# Patient Record
Sex: Male | Born: 1980 | Race: Black or African American | Hispanic: No | Marital: Single | Smoking: Never smoker
Health system: Southern US, Community
[De-identification: ages and names within clinical notes are randomized; demographics above are authoritative.]

## PROBLEM LIST (undated history)

## (undated) DIAGNOSIS — G4730 Sleep apnea, unspecified: Secondary | ICD-10-CM

## (undated) DIAGNOSIS — E119 Type 2 diabetes mellitus without complications: Secondary | ICD-10-CM

## (undated) DIAGNOSIS — I10 Essential (primary) hypertension: Secondary | ICD-10-CM

## (undated) HISTORY — PX: INSERTION OF MESH: SHX5868

## (undated) HISTORY — PX: HERNIA REPAIR: SHX51

---

## 2004-09-14 ENCOUNTER — Emergency Department: Payer: Self-pay | Admitting: Emergency Medicine

## 2006-08-18 ENCOUNTER — Other Ambulatory Visit: Payer: Self-pay

## 2006-08-18 ENCOUNTER — Emergency Department: Payer: Self-pay | Admitting: Emergency Medicine

## 2008-06-13 ENCOUNTER — Emergency Department: Payer: Self-pay | Admitting: Emergency Medicine

## 2008-09-07 ENCOUNTER — Emergency Department: Payer: Self-pay | Admitting: Emergency Medicine

## 2008-09-20 ENCOUNTER — Emergency Department: Payer: Self-pay | Admitting: Emergency Medicine

## 2008-10-14 ENCOUNTER — Emergency Department: Payer: Self-pay | Admitting: Emergency Medicine

## 2009-12-08 ENCOUNTER — Emergency Department: Payer: Self-pay | Admitting: Emergency Medicine

## 2010-02-05 ENCOUNTER — Emergency Department: Payer: Self-pay | Admitting: Emergency Medicine

## 2010-04-24 ENCOUNTER — Ambulatory Visit: Payer: Self-pay | Admitting: Emergency Medicine

## 2012-02-27 ENCOUNTER — Emergency Department: Payer: Self-pay | Admitting: Emergency Medicine

## 2012-02-27 LAB — URINALYSIS, COMPLETE
Blood: NEGATIVE
Glucose,UR: NEGATIVE mg/dL (ref 0–75)
Leukocyte Esterase: NEGATIVE
Nitrite: NEGATIVE
Ph: 6 (ref 4.5–8.0)
RBC,UR: 2 /HPF (ref 0–5)
Specific Gravity: 1.023 (ref 1.003–1.030)
Squamous Epithelial: <1
WBC UR: 15 /HPF (ref 0–5)

## 2012-02-29 ENCOUNTER — Emergency Department: Payer: Self-pay | Admitting: Emergency Medicine

## 2012-05-17 ENCOUNTER — Emergency Department: Payer: Self-pay | Admitting: Emergency Medicine

## 2012-06-23 ENCOUNTER — Emergency Department: Payer: Self-pay | Admitting: Emergency Medicine

## 2012-06-23 LAB — CBC
HGB: 15.5 g/dL (ref 13.0–18.0)
MCH: 26.5 pg (ref 26.0–34.0)
MCHC: 32.4 g/dL (ref 32.0–36.0)
MCV: 82 fL (ref 80–100)
Platelet: 194 10*3/uL (ref 150–440)
RBC: 5.87 10*6/uL (ref 4.40–5.90)
RDW: 16.2 % — ABNORMAL HIGH (ref 11.5–14.5)
WBC: 10.2 10*3/uL (ref 3.8–10.6)

## 2012-06-23 LAB — BASIC METABOLIC PANEL
BUN: 11 mg/dL (ref 7–18)
Chloride: 108 mmol/L — ABNORMAL HIGH (ref 98–107)
Co2: 28 mmol/L (ref 21–32)
Creatinine: 0.99 mg/dL (ref 0.60–1.30)
EGFR (African American): >60
EGFR (Non-African Amer.): >60
Glucose: 119 mg/dL — ABNORMAL HIGH (ref 65–99)
Osmolality: 282 (ref 275–301)
Potassium: 3.9 mmol/L (ref 3.5–5.1)

## 2012-08-22 ENCOUNTER — Emergency Department: Payer: Self-pay | Admitting: Emergency Medicine

## 2012-08-29 ENCOUNTER — Emergency Department: Payer: Self-pay | Admitting: Emergency Medicine

## 2013-03-25 ENCOUNTER — Emergency Department: Payer: Self-pay | Admitting: Internal Medicine

## 2013-08-13 ENCOUNTER — Emergency Department: Payer: Self-pay | Admitting: Emergency Medicine

## 2013-08-13 LAB — CBC
HCT: 47.3 % (ref 40.0–52.0)
HGB: 15 g/dL (ref 13.0–18.0)
MCH: 26.8 pg (ref 26.0–34.0)
MCHC: 31.7 g/dL — AB (ref 32.0–36.0)
MCV: 85 fL (ref 80–100)
PLATELETS: 166 10*3/uL (ref 150–440)
RBC: 5.6 10*6/uL (ref 4.40–5.90)
RDW: 15.6 % — ABNORMAL HIGH (ref 11.5–14.5)
WBC: 7.7 10*3/uL (ref 3.8–10.6)

## 2013-08-13 LAB — BASIC METABOLIC PANEL
ANION GAP: 8 (ref 7–16)
BUN: 10 mg/dL (ref 7–18)
CALCIUM: 8.2 mg/dL — AB (ref 8.5–10.1)
CREATININE: 0.78 mg/dL (ref 0.60–1.30)
Chloride: 108 mmol/L — ABNORMAL HIGH (ref 98–107)
Co2: 26 mmol/L (ref 21–32)
EGFR (Non-African Amer.): >60
Glucose: 137 mg/dL — ABNORMAL HIGH (ref 65–99)
Osmolality: 284 (ref 275–301)
Potassium: 3.8 mmol/L (ref 3.5–5.1)
Sodium: 142 mmol/L (ref 136–145)

## 2013-08-13 LAB — TROPONIN I: Troponin-I: <0.02 ng/mL

## 2013-09-06 ENCOUNTER — Emergency Department: Payer: Self-pay | Admitting: Emergency Medicine

## 2016-05-30 ENCOUNTER — Emergency Department: Payer: Self-pay

## 2016-05-30 ENCOUNTER — Emergency Department
Admission: EM | Admit: 2016-05-30 | Discharge: 2016-05-30 | Disposition: A | Discharge status: Home / Self Care | Payer: Self-pay | Attending: Emergency Medicine | Admitting: Emergency Medicine

## 2016-05-30 ENCOUNTER — Encounter: Payer: Self-pay | Admitting: Emergency Medicine

## 2016-05-30 DIAGNOSIS — M79602 Pain in left arm: Secondary | ICD-10-CM | POA: Insufficient documentation

## 2016-05-30 DIAGNOSIS — R200 Anesthesia of skin: Secondary | ICD-10-CM | POA: Insufficient documentation

## 2016-05-30 DIAGNOSIS — R0789 Other chest pain: Secondary | ICD-10-CM | POA: Insufficient documentation

## 2016-05-30 LAB — CBC
HCT: 50.6 % (ref 40.0–52.0)
Hemoglobin: 16.6 g/dL (ref 13.0–18.0)
MCH: 26.9 pg (ref 26.0–34.0)
MCHC: 32.7 g/dL (ref 32.0–36.0)
MCV: 82.3 fL (ref 80.0–100.0)
PLATELETS: 159 10*3/uL (ref 150–440)
RBC: 6.15 MIL/uL — AB (ref 4.40–5.90)
RDW: 15.5 % — ABNORMAL HIGH (ref 11.5–14.5)
WBC: 9.1 10*3/uL (ref 3.8–10.6)

## 2016-05-30 LAB — BASIC METABOLIC PANEL
ANION GAP: 5 (ref 5–15)
BUN: 9 mg/dL (ref 6–20)
CHLORIDE: 106 mmol/L (ref 101–111)
CO2: 29 mmol/L (ref 22–32)
CREATININE: 0.93 mg/dL (ref 0.61–1.24)
Calcium: 9 mg/dL (ref 8.9–10.3)
GFR calc non Af Amer: >60 mL/min (ref >60)
Glucose, Bld: 159 mg/dL — ABNORMAL HIGH (ref 65–99)
POTASSIUM: 4.3 mmol/L (ref 3.5–5.1)
SODIUM: 140 mmol/L (ref 135–145)

## 2016-05-30 LAB — TROPONIN I
Troponin I: <0.03 ng/mL (ref <0.03)
Troponin I: <0.03 ng/mL (ref <0.03)

## 2016-05-30 MED ORDER — KETOROLAC TROMETHAMINE 30 MG/ML IJ SOLN
30.0000 mg | Freq: Once | INTRAMUSCULAR | Status: AC
  Administered 2016-05-30: 30 mg via INTRAMUSCULAR
  Filled 2016-05-30: qty 1

## 2016-05-30 MED ORDER — PREDNISONE 20 MG PO TABS
40.0000 mg | ORAL_TABLET | Freq: Once | ORAL | Status: AC
  Administered 2016-05-30: 40 mg via ORAL
  Filled 2016-05-30: qty 2

## 2016-05-30 MED ORDER — PREDNISONE 20 MG PO TABS: 40.0000 mg | ORAL_TABLET | Freq: Every day | ORAL | 0 refills | Status: DC

## 2016-05-30 MED ORDER — HYDROCODONE-ACETAMINOPHEN 5-325 MG PO TABS: 1.0000 | ORAL_TABLET | Freq: Four times a day (QID) | ORAL | 0 refills | Status: DC | PRN

## 2016-05-30 NOTE — Discharge Instructions (Signed)
You have been seen in the Emergency Department (ED) today for chest pain.  As we have discussed today?s test results are normal, and we believe your pain is due to pain/strain and/or inflammation of the muscles and/or cartilage of your chest wall.  We recommend you take ibuprofen 600 mg three times a day with meals for the next 5 days (unless you have been told previously not to take ibuprofen or NSAIDs in general).    Return to the Emergency Department (ED) if you experience any further chest pain/pressure/tightness, difficulty breathing, or sudden sweating, or other symptoms that concern you.

## 2016-05-30 NOTE — ED Triage Notes (Signed)
Patient presents with left arm pain/numbness and tingling for 2 days with LROM

## 2016-05-30 NOTE — ED Provider Notes (Signed)
[place] [facility] and interpreted by me at noon [facility] Emergency [facility] Provider Note   ____________________________________________   First MD Initiated Contact with Patient 05/30/16 1342     (approximate)  I have reviewed the triage vital signs and the nursing notes.   HISTORY  Chief Complaint Arm Pain    HPI Tony Ham. is a 36 y.o. male report no medical history  Patient reports that for about 2 days he started experiencing pain in his upper back that radiates down towards his shoulder and towards the fingertips of his left hand. Feels like a tingling discomfort. He did not injure the arm. He reports the pain is moderate to severe, much worse when he tries to move the left arm or pick up his left arm.  Reports a slight tingling feeling, but denies that he lost sensation. He reports his muscle strength is normal, but hurts to move. He denies having any "chest pain" or have any trouble breathing.  He has not had anything to alleviate the symptoms. Not worsened by walking, but worsened by trying to move the left arm.  Minimal discomfort in the neck.  No fevers chills or trouble breathing.  Denies any history of heart problems.   History reviewed. No pertinent past medical history.  There are no active problems to display for this patient.   No past surgical history on file.  Prior to Admission medications   Medication Sig Start Date End Date Taking? Authorizing Provider  HYDROcodone-acetaminophen (NORCO/VICODIN) 5-325 MG tablet Take 1 tablet by mouth every 6 (six) hours as needed for moderate pain. 05/30/16   Sharyn Creamer, MD  predniSONE (DELTASONE) 20 MG tablet Take 2 tablets (40 mg total) by mouth daily. 05/30/16   Sharyn Creamer, MD  none  Allergies Patient has no allergy information on record.  History reviewed. No pertinent family history.  Social History Social History  Substance Use Topics  . Smoking status: Not on file  .  Smokeless tobacco: Not on file  . Alcohol use Not on file    Review of Systems Constitutional: No fever/chills Eyes: No visual changes. ENT: No sore throat. Cardiovascular: Denies chest pain. Respiratory: Denies shortness of breath. Gastrointestinal: No abdominal pain.  No nausea, no vomiting.   Genitourinary: Negative for dysuria. Musculoskeletal: see history of present illness Skin: Negative for rash. Neurological: Negative for headaches, focal weakness or numbness.no trouble speaking. No weakness in the arms or legs. No trouble walking  10-point ROS otherwise negative.  ____________________________________________   PHYSICAL EXAM:  VITAL SIGNS: ED Triage Vitals  Enc Vitals Group     BP 05/30/16 0904 (!) 184/105     Pulse Rate 05/30/16 0904 91     Resp 05/30/16 0904 20     Temp 05/30/16 0904 98.9 F (37.2 C)     Temp Source 05/30/16 0904 Oral     SpO2 05/30/16 0904 93 %     Weight 05/30/16 0905 (!) 375 lb (170.1 kg)     Height 05/30/16 0905  (1.88 m)     Head Circumference --      Peak Flow --      Pain Score 05/30/16 0904 8     Pain Loc --      Pain Edu? --      Excl. in GC? --     Constitutional: Alert and oriented. Well appearing and in no acute distress. Eyes: Conjunctivae are normal. PERRL. EOMI. Head: Atraumatic. Nose: No congestion/rhinnorhea. Mouth/Throat: Mucous  membranes are moist.  Oropharynx non-erythematous. Neck: No stridor.  No midline cervical tenderness. He does report tenderness along the left paraspinous muscles, his tenderness across the trapezius that he reports radiates pain into his left arm and shoulder with palpation. Cardiovascular: Normal rate, regular rhythm. Grossly normal heart sounds.  Good peripheral circulation. Respiratory: Normal respiratory effort.  No retractions. Lungs CTAB. Gastrointestinal: Soft and nontender. No distention. No abdominal bruits. No CVA tenderness. Musculoskeletal: No lower extremity tenderness nor  edema.  No joint effusions. Neurologic:  Normal speech and language. No gross focal neurologic deficits are appreciated. No gait instability. Skin:  Skin is warm, dry and intact. No rash noted. Psychiatric: Mood and affect are normal. Speech and behavior are normal.  ____________________________________________   LABS (all labs ordered are listed, but only abnormal results are displayed)  Labs Reviewed  BASIC METABOLIC PANEL - Abnormal; Notable for the following:       Result Value   Glucose, Bld 159 (*)    All other components within normal limits  CBC - Abnormal; Notable for the following:    RBC 6.15 (*)    RDW 15.5 (*)    All other components within normal limits  TROPONIN I  TROPONIN I   ____________________________________________  EKG ED ECG REPORT I, QUALE, MARK, the attending physician, personally viewed and interpreted this ECG.  Date: 05/30/2016 EKG Time: 905 Rate: 85 Rhythm: normal sinus rhythm QRS Axis: normal Intervals: normal ST/T Wave abnormalities: normal Conduction Disturbances: none Narrative Interpretation: unremarkable   ____________________________________________  RADIOLOGY  Dg Chest 2 View  Result Date: 05/30/2016 CLINICAL DATA:  Pain EXAM: CHEST  2 VIEW COMPARISON:  March 25, 2013 FINDINGS: There is no edema or consolidation. The heart size and pulmonary vascularity are normal. No adenopathy. No pneumothorax. No bone lesions. IMPRESSION: No edema or consolidation. Electronically Signed   By: Bretta Bang III M.D.   On: 05/30/2016 14:17    ____________________________________________   PROCEDURES  Procedure(s) performed: None  Procedures  Critical Care performed: No  ____________________________________________   INITIAL IMPRESSION / ASSESSMENT AND PLAN / ED COURSE  Pertinent labs & imaging results that were available during my care of the patient were reviewed by me and considered in my medical decision making (see chart  for details).    Clinical Course as of May 30 1652  Sat May 30, 2016  1342 There was a delay in seeing this patient due to a critical resuscitation.  [MQ]  1539 Patient now using his left arm well. Reports his pain is much better and he feels improved.  [MQ]    Clinical Course User Index [MQ] Sharyn Creamer, MD        Pulmonary Embolism Rule-out Criteria (PERC rule)                        If YES to ANY of the following, the [facility] The rule is not satisfied and cannot be used to rule out PE in this patient (consider d-dimer or imaging depending on pre-test probability).                      If NO to ALL of the following, AND the clinician's pre-test probability is <15%, the [facility] rule is satisfied and there is no need for further workup (including no need to obtain a d-dimer) as the post-test probability of pulmonary embolism is <2%.  Mnemonic is HAD CLOTS   H - hormone use (exogenous estrogen)      No. A - age > 50                                                 No. D - DVT/PE history                                      No.   C - coughing blood (hemoptysis)                 No. L - leg swelling, unilateral                             No. O - O2 Sat on Room Air < 95%                  No. T - tachycardia (HR ? 100)                         No. S - surgery or trauma, recent                      No.   Based on my evaluation of the patient, including application of this decision instrument, further testing to evaluate for pulmonary embolism is not indicated at this time.  Treatments for evaluation of left upper back and arm pain. Very reproducible, with no symptoms of typical acute coronary syndrome. No risk factors for pulmonary emboli some. Denies chest pain or trouble breathing.  His examination appears consistent with musculoskeletal chest pain, and I feel is very low pretest probability for acute emergent cause such as coronary syndrome. The patient does have somewhat  radicular distribution of his pain, but he has no evidence of a dense sensory deficit or musculoskeletal or vascular compromise. At this point and discussed with the patient we'll treat him conservatively with brief course of steroids, pain medication, and close follow-up with orthopedics team.  Discuss careful return precautions with the patient including returning if he is weakness in the arm, increasing numbness, trouble using the arm or hand, develops a cold or blue hand, chest pain trouble breathing or other concerns arise and he is very agreeable.  I will prescribe the patient a narcotic pain medicine due to their condition which I anticipate will cause at least moderate pain short term. I discussed with the patient safe use of narcotic pain medicines, and that they are not to drive, work in dangerous areas, or ever take more than prescribed (no more than 1 pill every 6 hours). We discussed that this is the type of medication that can be  overdosed on and the risks of this type of medicine. Patient is very agreeable to only use as prescribed and to never use more than prescribed.     ____________________________________________   FINAL CLINICAL IMPRESSION(S) / ED DIAGNOSES  Final diagnoses:  Musculoskeletal chest pain  Chest pain, atypical      NEW MEDICATIONS STARTED DURING THIS VISIT:  New Prescriptions   HYDROCODONE-ACETAMINOPHEN (NORCO/VICODIN) 5-325 MG TABLET    Take 1 tablet by mouth every 6 (six) hours as needed for  moderate pain.   PREDNISONE (DELTASONE) 20 MG TABLET    Take 2 tablets (40 mg total) by mouth daily.     Note:  This document was prepared using Dragon voice recognition software and may include unintentional dictation errors.     Sharyn Creamer, MD 05/30/16 1655

## 2016-05-30 NOTE — ED Notes (Signed)
Pt states pain starts in the back goes through the shoulder down the arm into the fingertips and hurts for this nurse to palpate or for pt to move. Denies injury. Pain x 2 days.

## 2016-06-12 ENCOUNTER — Encounter: Payer: Self-pay | Admitting: Emergency Medicine

## 2016-06-12 DIAGNOSIS — M5412 Radiculopathy, cervical region: Secondary | ICD-10-CM | POA: Insufficient documentation

## 2016-06-12 DIAGNOSIS — F17200 Nicotine dependence, unspecified, uncomplicated: Secondary | ICD-10-CM | POA: Insufficient documentation

## 2016-06-12 NOTE — ED Triage Notes (Signed)
Pt ambulatory to triage in NAD, report left shoulder pain x 1 week, was seen earlier and had cardiac workup and does not want to do that again. Pt states he feels it could be pinched nerve.

## 2016-06-13 ENCOUNTER — Emergency Department: Payer: Self-pay

## 2016-06-13 ENCOUNTER — Emergency Department
Admission: EM | Admit: 2016-06-13 | Discharge: 2016-06-13 | Disposition: A | Discharge status: Home / Self Care | Payer: Self-pay | Attending: Emergency Medicine | Admitting: Emergency Medicine

## 2016-06-13 DIAGNOSIS — M5412 Radiculopathy, cervical region: Secondary | ICD-10-CM

## 2016-06-13 MED ORDER — HYDROCODONE-ACETAMINOPHEN 5-325 MG PO TABS
2.0000 | ORAL_TABLET | Freq: Once | ORAL | Status: AC
  Administered 2016-06-13: 2 via ORAL
  Filled 2016-06-13: qty 2

## 2016-06-13 MED ORDER — HYDROCODONE-ACETAMINOPHEN 5-325 MG PO TABS: 1.0000 | ORAL_TABLET | ORAL | 0 refills | Status: DC | PRN

## 2016-06-13 NOTE — Discharge Instructions (Signed)
Please make an appointment to follow-up with neurosurgery within the next week or so for reevaluation. Return to the emergency department sooner for any new or worsening symptoms.  It was a pleasure to take care of you today, and thank you for coming to our emergency department.  If you have any questions or concerns before leaving please ask the nurse to grab me and I'm more than happy to go through your aftercare instructions again.  If you were prescribed any opioid pain medication today such as Norco, Vicodin, Percocet, morphine, hydrocodone, or oxycodone please make sure you do not drive when you are taking this medication as it can alter your ability to drive safely.  If you have any concerns once you are home that you are not improving or are in fact getting worse before you can make it to your follow-up appointment, please do not hesitate to call 911 and come back for further evaluation.  Tony Brittle MD  Results for orders placed or performed during the hospital encounter of 05/30/16  Basic metabolic panel  Result Value Ref Range   Sodium 140 135 - 145 mmol/L   Potassium 4.3 3.5 - 5.1 mmol/L   Chloride 106 101 - 111 mmol/L   CO2 29 22 - 32 mmol/L   Glucose, Bld 159 (H) 65 - 99 mg/dL   BUN 9 6 - 20 mg/dL   Creatinine, Ser 1.61 0.61 - 1.24 mg/dL   Calcium 9.0 8.9 - 09.6 mg/dL   GFR calc non Af Amer >60 >60 mL/min   GFR calc Af Amer >60 >60 mL/min   Anion gap 5 5 - 15  CBC  Result Value Ref Range   WBC 9.1 3.8 - 10.6 K/uL   RBC 6.15 (H) 4.40 - 5.90 MIL/uL   Hemoglobin 16.6 13.0 - 18.0 g/dL   HCT 04.5 40.9 - 81.1 %   MCV 82.3 80.0 - 100.0 fL   MCH 26.9 26.0 - 34.0 pg   MCHC 32.7 32.0 - 36.0 g/dL   RDW 91.4 (H) 78.2 - 95.6 %   Platelets 159 150 - 440 K/uL  Troponin I  Result Value Ref Range   Troponin I <0.03 <0.03 ng/mL  Troponin I  Result Value Ref Range   Troponin I <0.03 <0.03 ng/mL   Dg Chest 2 View  Result Date: 05/30/2016 CLINICAL DATA:  Pain EXAM: CHEST  2  VIEW COMPARISON:  March 25, 2013 FINDINGS: There is no edema or consolidation. The heart size and pulmonary vascularity are normal. No adenopathy. No pneumothorax. No bone lesions. IMPRESSION: No edema or consolidation. Electronically Signed   By: Bretta Bang III M.D.   On: 05/30/2016 14:17   Dg Cervical Spine Complete  Result Date: 06/13/2016 CLINICAL DATA:  Left shoulder radiculopathy since the end of April. Tingling with intermittent numbness in the left fingers. EXAM: CERVICAL SPINE - COMPLETE 4+ VIEW COMPARISON:  Soft tissue neck 06/23/2012 FINDINGS: Reversal of the usual cervical lordosis without anterior subluxation. This may be due to patient positioning but ligamentous injury or muscle spasm could also have this appearance and are not excluded. Normal alignment of the facet joints. No significant bone encroachment upon the neural foramina. C1-2 articulation appears intact. No vertebral compression deformities. No prevertebral soft tissue swelling. No focal bone lesion or bone destruction. Degenerative changes with disc space narrowing and end plate hypertrophic changes at C4-5, C5-6, and C6-7 levels. IMPRESSION: Nonspecific reversal of the usual cervical lordosis. Degenerative changes in the cervical spine. No acute  displaced fractures identified. Electronically Signed   By: Burman NievesWilliam  Stevens M.D.   On: 06/13/2016 05:18   Dg Shoulder Left  Result Date: 06/13/2016 CLINICAL DATA:  Acute onset of left shoulder pain. Initial encounter. EXAM: LEFT SHOULDER - 2+ VIEW COMPARISON:  None. FINDINGS: There is no evidence of fracture or dislocation. The left humeral head is seated within the glenoid fossa. Minimal degenerative sclerosis is noted at the left glenoid. The acromioclavicular joint is unremarkable in appearance. No significant soft tissue abnormalities are seen. The visualized portions of the left lung are clear. IMPRESSION: No evidence of fracture or dislocation. Electronically Signed   By:  Roanna RaiderJeffery  Chang M.D.   On: 06/13/2016 00:23

## 2016-06-13 NOTE — ED Notes (Signed)
Had to awake pt from sleep to complete assessment. Pt states he has posterior and anterior left shoulder pain since the end of [place]. Pt describes pain as tingling with intermittent numbness in left fingers. Cms currently intact to left fingers. 3+ left radial pulse noted. Pt states pain is worse with movement and "lying down".

## 2016-06-13 NOTE — ED Notes (Signed)
Pt was sleeping soundly in lobby when this RN located pt and woke him up to be escorted to treatment room. Pt was sleeping with arms crossed under his shirt.

## 2016-06-13 NOTE — ED Notes (Signed)
Pt verbalized understanding of discharge instructions. NAD at this time. 

## 2016-06-13 NOTE — ED Provider Notes (Signed)
[facility] Emergency Department Provider Note  ____________________________________________   First MD Initiated Contact with Patient 06/13/16 [phone]     (approximate)  I have reviewed the triage vital signs and the nursing notes.   HISTORY  Chief Complaint Shoulder Pain    HPI Tony CiscoLorenzo B Biven Sr. is a 36 y.o. male who comes to the emergency department with 3 weeks of severe aching left shoulder pain radiating down the left side of his neck down to his fingertips. He was seen in our emergency department about 2-1/2 weeks ago and was diagnosed with atypical chest pain. He is taking 800 mg of ibuprofen and 1000 mg of Tylenol at home since then without relief. He has not seen a physician since that visit secondary to work issues. He is left-hand dominant. He denies particular trauma.The pain is associated with tingling numbness and "twitching".      History reviewed. No pertinent past medical history.  There are no active problems to display for this patient.   History reviewed. No pertinent surgical history.  Prior to Admission medications   Medication Sig Start Date End Date Taking? Authorizing Provider  HYDROcodone-acetaminophen (NORCO) 5-325 MG tablet Take 1 tablet by mouth every 4 (four) hours as needed for moderate pain. 06/13/16   Merrily Brittleifenbark, Dyron Kawano, MD    Allergies Patient has no known allergies.  History reviewed. No pertinent family history.  Social History Social History  Substance Use Topics  . Smoking status: Current Some Day Smoker  . Smokeless tobacco: Never Used  . Alcohol use No    Review of Systems Constitutional: No fever/chills Eyes: No visual changes. ENT: No sore throat. Cardiovascular: Denies chest pain. Respiratory: Denies shortness of breath. Gastrointestinal: No abdominal pain.  No nausea, no vomiting.  No diarrhea.  No constipation. Genitourinary: Negative for dysuria. Musculoskeletal: Negative for back pain. Skin:  Negative for rash. Neurological: Negative for headaches, focal weakness or numbness.  10-point ROS otherwise negative.  ____________________________________________   PHYSICAL EXAM:  VITAL SIGNS: ED Triage Vitals [06/12/16 2355]  Enc Vitals Group     BP (!) 167/113     Pulse Rate 83     Resp 16     Temp 98.3 F (36.8 C)     Temp Source Oral     SpO2 96 %     Weight (!) 367 lb (166.5 kg)     Height 6\' 2"  (1.88 m)     Head Circumference      Peak Flow      Pain Score 10     Pain Loc      Pain Edu?      Excl. in GC?     Constitutional: Alert and oriented x 4Appears uncomfortable Eyes: PERRL EOMI. Head: Atraumatic. Nose: No congestion/rhinnorhea. Mouth/Throat: No trismus Neck: No stridor.   Cardiovascular: Normal rate, regular rhythm. Grossly normal heart sounds.  Good peripheral circulation. Respiratory: Normal respiratory effort.  No retractions. Lungs CTAB and moving good air Gastrointestinal: Soft nontender Musculoskeletal: Full range of motion left shoulder although with some discomfort Neurologic:  Normal speech and language. No gross focal neurologic deficits are appreciated. Skin:  Skin is warm, dry and intact. No rash noted. Psychiatric: Mood and affect are normal. Speech and behavior are normal.     ____________________________________________   LABS (all labs ordered are listed, but only abnormal results are displayed)  Labs Reviewed - No data to display   __________________________________________  EKG   ____________________________________________  RADIOLOGY  Cervical spine  x-ray shows degenerative disc disease ____________________________________________   PROCEDURES  Procedure(s) performed: no  Procedures  Critical Care performed: no  Observation: no ____________________________________________   INITIAL IMPRESSION / ASSESSMENT AND PLAN / ED COURSE  Pertinent labs & imaging results that were available during my care of the  patient were reviewed by me and considered in my medical decision making (see chart for details).  By the time I saw the patient had artery had an x-ray of his left shoulder which was negative. To me his symptoms are more consistent with radiculopathy as he has pain in his neck radiating down his left arm associated with tingling numbness and electrical sensation. I obtained an x-ray of the cervical spine which shows degenerative disc disease in his neck which could likely very well explain his symptoms. I will refer him to neurosurgery for outpatient evaluation. He is discharged home in improved condition. Has normal motor strength throughout the left upper extremity.      ____________________________________________   FINAL CLINICAL IMPRESSION(S) / ED DIAGNOSES  Final diagnoses:  Cervical radiculopathy      NEW MEDICATIONS STARTED DURING THIS VISIT:  New Prescriptions   HYDROCODONE-ACETAMINOPHEN (NORCO) 5-325 MG TABLET    Take 1 tablet by mouth every 4 (four) hours as needed for moderate pain.     Note:  This document was prepared using Dragon voice recognition software and may include unintentional dictation errors.     Merrily Brittle, MD 06/13/16 0730

## 2016-06-13 NOTE — ED Notes (Signed)
Pt verbalizes understanding that he is not to drive for 4-8 hours after administraion of norco. Pt verbalizes understanding and states he has a ride home.

## 2016-06-13 NOTE — ED Notes (Signed)
Report to kim, rn.  

## 2016-09-26 ENCOUNTER — Encounter: Payer: Self-pay | Admitting: Emergency Medicine

## 2016-09-26 ENCOUNTER — Emergency Department: Payer: Medicaid Other

## 2016-09-26 ENCOUNTER — Emergency Department
Admission: EM | Admit: 2016-09-26 | Discharge: 2016-09-26 | Disposition: A | Discharge status: Home / Self Care | Payer: Medicaid Other | Attending: Emergency Medicine | Admitting: Emergency Medicine

## 2016-09-26 DIAGNOSIS — E119 Type 2 diabetes mellitus without complications: Secondary | ICD-10-CM | POA: Diagnosis not present

## 2016-09-26 DIAGNOSIS — K439 Ventral hernia without obstruction or gangrene: Secondary | ICD-10-CM | POA: Diagnosis not present

## 2016-09-26 DIAGNOSIS — F17210 Nicotine dependence, cigarettes, uncomplicated: Secondary | ICD-10-CM | POA: Insufficient documentation

## 2016-09-26 DIAGNOSIS — R1033 Periumbilical pain: Secondary | ICD-10-CM | POA: Diagnosis present

## 2016-09-26 HISTORY — DX: Type 2 diabetes mellitus without complications: E11.9

## 2016-09-26 LAB — CBC WITH DIFFERENTIAL/PLATELET
BASOS ABS: 0 10*3/uL (ref 0–0.1)
BASOS PCT: 0 %
EOS PCT: 2 %
Eosinophils Absolute: 0.1 10*3/uL (ref 0–0.7)
HCT: 47.8 % (ref 40.0–52.0)
Hemoglobin: 15.9 g/dL (ref 13.0–18.0)
LYMPHS ABS: 2.7 10*3/uL (ref 1.0–3.6)
Lymphocytes Relative: 32 %
MCH: 27.7 pg (ref 26.0–34.0)
MCHC: 33.2 g/dL (ref 32.0–36.0)
MCV: 83.3 fL (ref 80.0–100.0)
Monocytes Absolute: 1 10*3/uL (ref 0.2–1.0)
Monocytes Relative: 12 %
NEUTROS PCT: 54 %
Neutro Abs: 4.4 10*3/uL (ref 1.4–6.5)
PLATELETS: 165 10*3/uL (ref 150–440)
RBC: 5.74 MIL/uL (ref 4.40–5.90)
RDW: 14.6 % — ABNORMAL HIGH (ref 11.5–14.5)
WBC: 8.3 10*3/uL (ref 3.8–10.6)

## 2016-09-26 LAB — URINALYSIS, COMPLETE (UACMP) WITH MICROSCOPIC
BACTERIA UA: NONE SEEN
Bilirubin Urine: NEGATIVE
Glucose, UA: NEGATIVE mg/dL
Hgb urine dipstick: NEGATIVE
Ketones, ur: NEGATIVE mg/dL
Leukocytes, UA: NEGATIVE
Nitrite: NEGATIVE
PROTEIN: NEGATIVE mg/dL
Specific Gravity, Urine: 1.019 (ref 1.005–1.030)
pH: 6 (ref 5.0–8.0)

## 2016-09-26 LAB — BASIC METABOLIC PANEL
ANION GAP: 8 (ref 5–15)
BUN: 10 mg/dL (ref 6–20)
CHLORIDE: 104 mmol/L (ref 101–111)
CO2: 26 mmol/L (ref 22–32)
Calcium: 9 mg/dL (ref 8.9–10.3)
Creatinine, Ser: 0.84 mg/dL (ref 0.61–1.24)
Glucose, Bld: 155 mg/dL — ABNORMAL HIGH (ref 65–99)
POTASSIUM: 4.2 mmol/L (ref 3.5–5.1)
SODIUM: 138 mmol/L (ref 135–145)

## 2016-09-26 LAB — LIPASE, BLOOD: LIPASE: 166 U/L — AB (ref 11–51)

## 2016-09-26 MED ORDER — MORPHINE SULFATE (PF) 4 MG/ML IV SOLN
4.0000 mg | Freq: Once | INTRAVENOUS | Status: AC
  Administered 2016-09-26: 4 mg via INTRAVENOUS
  Filled 2016-09-26: qty 1

## 2016-09-26 MED ORDER — IOPAMIDOL (ISOVUE-300) INJECTION 61%
125.0000 mL | Freq: Once | INTRAVENOUS | Status: AC | PRN
  Administered 2016-09-26: 125 mL via INTRAVENOUS

## 2016-09-26 MED ORDER — OXYCODONE-ACETAMINOPHEN 5-325 MG PO TABS: 1.0000 | ORAL_TABLET | Freq: Four times a day (QID) | ORAL | 0 refills | Status: DC | PRN

## 2016-09-26 NOTE — ED Triage Notes (Signed)
Pt arrives ambulatory to triage with c/o Mesh for his hernia "bunching up". Pt had mesh inserted in 2013. Pt reports that pain started around 2 days ago. Pt is in NAD at this time.

## 2016-09-26 NOTE — Discharge Instructions (Signed)
Fortunately today your blood work in your CT scan were reassuring. The pain should slowly begin to get better over the next 24-48 hours. Make an appointment to follow-up with the general surgeon as needed and return to the emergency department for any concerns such as fevers, chills, worsening pain, if you cannot eat or drink, or for any other issues whatsoever.  It was a pleasure to take care of you today, and thank you for coming to our emergency department.  If you have any questions or concerns before leaving please ask the nurse to grab me and I'm more than happy to go through your aftercare instructions again.  If you were prescribed any opioid pain medication today such as Norco, Vicodin, Percocet, morphine, hydrocodone, or oxycodone please make sure you do not drive when you are taking this medication as it can alter your ability to drive safely.  If you have any concerns once you are home that you are not improving or are in fact getting worse before you can make it to your follow-up appointment, please do not hesitate to call 911 and come back for further evaluation.  Merrily Brittle, MD  Results for orders placed or performed during the hospital encounter of 09/26/16  Lipase, blood  Result Value Ref Range   Lipase 166 (H) 11 - 51 U/L  CBC with Differential  Result Value Ref Range   WBC 8.3 3.8 - 10.6 K/uL   RBC 5.74 4.40 - 5.90 MIL/uL   Hemoglobin 15.9 13.0 - 18.0 g/dL   HCT 40.9 81.1 - 91.4 %   MCV 83.3 80.0 - 100.0 fL   MCH 27.7 26.0 - 34.0 pg   MCHC 33.2 32.0 - 36.0 g/dL   RDW 78.2 (H) 95.6 - 21.3 %   Platelets 165 150 - 440 K/uL   Neutrophils Relative % 54 %   Neutro Abs 4.4 1.4 - 6.5 K/uL   Lymphocytes Relative 32 %   Lymphs Abs 2.7 1.0 - 3.6 K/uL   Monocytes Relative 12 %   Monocytes Absolute 1.0 0.2 - 1.0 K/uL   Eosinophils Relative 2 %   Eosinophils Absolute 0.1 0 - 0.7 K/uL   Basophils Relative 0 %   Basophils Absolute 0.0 0 - 0.1 K/uL  Basic metabolic panel    Result Value Ref Range   Sodium 138 135 - 145 mmol/L   Potassium 4.2 3.5 - 5.1 mmol/L   Chloride 104 101 - 111 mmol/L   CO2 26 22 - 32 mmol/L   Glucose, Bld 155 (H) 65 - 99 mg/dL   BUN 10 6 - 20 mg/dL   Creatinine, Ser 0.86 0.61 - 1.24 mg/dL   Calcium 9.0 8.9 - 57.8 mg/dL   GFR calc non Af Amer >60 >60 mL/min   GFR calc Af Amer >60 >60 mL/min   Anion gap 8 5 - 15  Urinalysis, Complete w Microscopic  Result Value Ref Range   Color, Urine YELLOW (A) YELLOW   APPearance CLEAR (A) CLEAR   Specific Gravity, Urine 1.019 1.005 - 1.030   pH 6.0 5.0 - 8.0   Glucose, UA NEGATIVE NEGATIVE mg/dL   Hgb urine dipstick NEGATIVE NEGATIVE   Bilirubin Urine NEGATIVE NEGATIVE   Ketones, ur NEGATIVE NEGATIVE mg/dL   Protein, ur NEGATIVE NEGATIVE mg/dL   Nitrite NEGATIVE NEGATIVE   Leukocytes, UA NEGATIVE NEGATIVE   RBC / HPF 0-5 0 - 5 RBC/hpf   WBC, UA 0-5 0 - 5 WBC/hpf   Bacteria, UA NONE  SEEN NONE SEEN   Squamous Epithelial / LPF 0-5 (A) NONE SEEN   Mucus PRESENT    Ct Abdomen Pelvis W Contrast  Result Date: 09/26/2016 CLINICAL DATA:  Diffuse abdominal and pelvic pain for 1 week. Abdominal distention. EXAM: CT ABDOMEN AND PELVIS WITH CONTRAST TECHNIQUE: Multidetector CT imaging of the abdomen and pelvis was performed using the standard protocol following bolus administration of intravenous contrast. CONTRAST:  ISOVUE-300 IOPAMIDOL (ISOVUE-300) INJECTION 61% COMPARISON:  12/08/2009 FINDINGS: Lower Chest: No acute findings. Hepatobiliary: No hepatic masses identified. Diffuse hepatic steatosis again noted. Gallbladder is unremarkable. Pancreas:  No mass or inflammatory changes. Spleen: Within normal limits in size and appearance. Adrenals/Urinary Tract: No masses identified. No evidence of hydronephrosis. Stomach/Bowel: No evidence of obstruction, inflammatory process or abnormal fluid collections. Vascular/Lymphatic: No pathologically enlarged lymph nodes. No abdominal aortic aneurysm.  Reproductive:  No mass or other significant abnormality. Other: Stable tiny supraumbilical ventral hernia containing only fat. Musculoskeletal:  No suspicious bone lesions identified. IMPRESSION: No acute findings. Hepatic steatosis. Stable tiny ventral hernia containing only fat. Electronically Signed   By: Myles Rosenthal M.D.   On: 09/26/2016 09:07

## 2016-09-26 NOTE — ED Provider Notes (Signed)
[facility] Emergency Department Provider Note  ____________________________________________   First MD Initiated Contact with Patient 09/26/16 [phone]     (approximate)  I have reviewed the triage vital signs and the nursing notes.   HISTORY  Chief Complaint Post-op Problem    HPI Tony Vue. is a 36 y.o. male who self presents to the emergency Department with roughly 3 days of postprandial severe umbilical pain. The pain is over the site of a previous umbilia repair.he's having normal bowel movements and flatus. No fevers or chills. Some nausea and no vomiting. Nothing seems to make the pain better or worse. No chest pain or shortness of breath.   Past Medical History:  Diagnosis Date  . Diabetes mellitus without complication (HCC)     There are no active problems to display for this patient.   Past Surgical History:  Procedure Laterality Date  . INSERTION OF MESH      Prior to Admission medications   Medication Sig Start Date End Date Taking? Authorizing Provider  HYDROcodone-acetaminophen (NORCO) 5-325 MG tablet Take 1 tablet by mouth every 4 (four) hours as needed for moderate pain. 06/13/16   Merrily Brittle, MD  oxyCODONE-acetaminophen (ROXICET) 5-325 MG tablet Take 1 tablet by mouth every 6 (six) hours as needed. 09/26/16 09/26/17  Merrily Brittle, MD    Allergies Patient has no known allergies.  No family history on file.  Social History Social History  Substance Use Topics  . Smoking status: Current Some Day Smoker    Packs/day: 0.50  . Smokeless tobacco: Never Used  . Alcohol use No    Review of Systems Constitutional: No fever/chills Eyes: No visual changes. ENT: No sore throat. Cardiovascular: Denies chest pain. Respiratory: Denies shortness of breath. Gastrointestinal: positive abdominal pain.  Positive nausea, no vomiting.  No diarrhea.  No constipation. Genitourinary: Negative for dysuria. Musculoskeletal:  Negative for back pain. Skin: Negative for rash. Neurological: Negative for headaches, focal weakness or numbness.   ____________________________________________   PHYSICAL EXAM:  VITAL SIGNS: ED Triage Vitals  Enc Vitals Group     BP 09/26/16 0720 (!) 169/110     Pulse Rate 09/26/16 0720 77     Resp 09/26/16 0720 18     Temp 09/26/16 0720 98.2 F (36.8 C)     Temp Source 09/26/16 0720 Oral     SpO2 09/26/16 0720 95 %     Weight 09/26/16 0624 (!) 375 lb (170.1 kg)     Height 09/26/16 0624 6\' 2"  (1.88 m)     Head Circumference --      Peak Flow --      Pain Score 09/26/16 0624 8     Pain Loc --      Pain Edu? --      Excl. in GC? --     Constitutional: alert and oriented 4 appears somewhat uncomfortable nontoxic no diaphoresis speaks in full clear sentences Eyes: PERRL EOMI. Head: Atraumatic. Nose: No congestion/rhinnorhea. Mouth/Throat: No trismus Neck: No stridor.   Cardiovascular: Normal rate, regular rhythm. Grossly normal heart sounds.  Good peripheral circulation. Respiratory: Normal respiratory effort.  No retractions. Lungs CTAB and moving good air Gastrointestinal: morbidly obese abdomen mild upper abdominal tenderness with no focality no rebound or guarding no peritonitis well-healed surgical scar unable to appreciate any hernia Musculoskeletal: No lower extremity edema   Neurologic:  Normal speech and language. No gross focal neurologic deficits are appreciated. Skin:  Skin is warm, dry and intact. No  rash noted. Psychiatric: Mood and affect are normal. Speech and behavior are normal.    ____________________________________________   DIFFERENTIAL includes but not limited to  Strength related hernia, incarcerated hernia, volvulus, small bowel obstruction, large bowel obstruction, appendicitis, diverticulitis ____________________________________________   LABS (all labs ordered are listed, but only abnormal results are displayed)  Labs Reviewed    LIPASE, BLOOD - Abnormal; Notable for the following:       Result Value   Lipase 166 (*)    All other components within normal limits  CBC WITH DIFFERENTIAL/PLATELET - Abnormal; Notable for the following:    RDW 14.6 (*)    All other components within normal limits  BASIC METABOLIC PANEL - Abnormal; Notable for the following:    Glucose, Bld 155 (*)    All other components within normal limits  URINALYSIS, COMPLETE (UACMP) WITH MICROSCOPIC - Abnormal; Notable for the following:    Color, Urine YELLOW (*)    APPearance CLEAR (*)    Squamous Epithelial / LPF 0-5 (*)    All other components within normal limits    Blood work unremarkable. Elevated lipase but not 3 times above normal __________________________________________  EKG   ____________________________________________  RADIOLOGY  CT scan with no clear etiology for the patient's symptoms ____________________________________________   PROCEDURES  Procedure(s) performed: no  Procedures  Critical Care performed: no  Observation: no ____________________________________________   INITIAL IMPRESSION / ASSESSMENT AND PLAN / ED COURSE  Pertinent labs & imaging results that were available during my care of the patient were reviewed by me and considered in my medical decision making (see chart for details).  The patient arrives up er abdominal discomfort and nausea although no vomiting. Abdomen is not peritonitis. CT scan with no clear etiology of the patient's symptoms. I appreciate his slightly elevated lipase however he is able to eat and dd keep food down. At this point I will refer him to general surgery as an outpatient and strict return precautions given.      ____________________________________________   FINAL CLINICAL IMPRESSION(S) / ED DIAGNOSES  Final diagnoses:  Ventral hernia without obstruction or gangrene  Periumbilical abdominal pain      NEW MEDICATIONS STARTED DURING THIS  VISIT:  Discharge Medication List as of 09/26/2016 10:16 AM    START taking these medications   Details  oxyCODONE-acetaminophen (ROXICET) 5-325 MG tablet Take 1 tablet by mouth every 6 (six) hours as needed., Starting Sat 09/26/2016, Until Sun 09/26/2017, Print         Note:  This document was prepared using Dragon voice recognition software and may include unintentional dictation errors.     Merrily Brittle, MD 09/27/16 2332

## 2016-09-26 NOTE — ED Notes (Signed)
Pt resting quietly on stretcher with eyes closed awaiting discharge paperwork.

## 2017-03-30 ENCOUNTER — Ambulatory Visit: Payer: Self-pay | Admitting: General Surgery

## 2017-03-30 NOTE — H&P (Signed)
[facility] PROFILE: Tony QuietLorenzo Minotti Sr. is a 37 y.o. male who [facility] for consultation at Tony request of Eula FlaxKrishna Kothary NP for evaluation of umbilical hernia.  PCP:  [facility], [facility]  HISTORY OF PRESENT ILLNESS: Tony West reports feeling a lump above Tony umbilicus since October 2018. Tony [facility] refers pain on Tony area above Tony umbilicus. Tony pain is aggravated with anything that increase his abdominal pressure (sitting down, doing heavy lifting, eating) and with direct palpation on Tony area. Resting improves Tony pain. Pain does not radiates to other part of Tony body. West denies nausea or vomiting. West had surgery on Tony same area of pain on 2013.    PROBLEM LIST:  Incisional hernia Hypertension Morbid obesity Diabetes mellitus Smoker  GENERAL REVIEW OF SYSTEMS:   General ROS: negative for - chills, fatigue, fever, weight gain or weight loss Allergy and Immunology ROS: negative for - hives  Hematological and Lymphatic ROS: negative for - bleeding problems or bruising, negative for palpable nodes Endocrine ROS: negative for - heat or cold intolerance, hair changes Respiratory ROS: negative for - cough, shortness of breath or wheezing Cardiovascular ROS: no chest pain or palpitations GI ROS: Positive for nausea, , abdominal pain, Negative for diarrhea, constipation, vomiting Musculoskeletal ROS: negative for - joint swelling or muscle pain Neurological ROS: negative for - confusion, syncope Dermatological ROS: negative for pruritus and rash Psychiatric: negative for anxiety, depression, difficulty sleeping and memory loss  MEDICATIONS: CurrentMedications        Current Outpatient Medications  Medication Sig Dispense Refill  . insulin GLARGINE (LANTUS) injection (concentration 100 units/mL) Inject subcutaneously every morning    . metFORMIN (GLUCOPHAGE) 500 MG tablet Take 500 mg by mouth once daily     No current  facility-administered medications for this visit.       ALLERGIES: Shellfish containing products  PAST MEDICAL HISTORY:     Past Medical History:  Diagnosis Date  . Diabetes mellitus type 2, uncomplicated (CMS-HCC)   Hypertension Active smoker Hyperlipidemia Morbid Obesity  PAST SURGICAL HISTORY:      Past Surgical History:  Procedure Laterality Date  . HERNIA REPAIR  2013     FAMILY HISTORY: History reviewed. No pertinent family history.   SOCIAL HISTORY: Social History        Socioeconomic History  . Marital status: Single    Spouse name: Not on file  . Number of children: Not on file  . Years of education: Not on file  . Highest education level: Not on file  Occupational History  . Not on file  Social Needs  . Financial resource strain: Not on file  . Food insecurity:    Worry: Not on file    Inability: Not on file  . Transportation needs:    Medical: Not on file    Non-medical: Not on file  Tobacco Use  . Smoking status: Current Some Day Smoker  . Smokeless tobacco: Never Used  Substance and Sexual Activity  . Alcohol use: Yes    Comment: occasionally   . Drug use: Never  . Sexual activity: Not on file  Lifestyle  . Physical activity:    Days per week: Not on file    Minutes per session: Not on file  . Stress: Not on file  Relationships  . Social connections:    Talks on phone: Not on file    Gets together: Not on file    Attends religious service: Not on file  Active member of club or organization: Not on file    Attends meetings of clubs or organizations: Not on file    Relationship status: Not on file  . Intimate partner violence:    Fear of current or ex partner: Not on file    Emotionally abused: Not on file    Physically abused: Not on file    Forced sexual activity: Not on file  Other Topics Concern  . Not on file  Social History Narrative  . Not on file    PHYSICAL EXAM:     Vitals:   03/30/17 1518  BP: (!) 141/91  Pulse: 89  Temp: 36.4 C (97.6 F)   Body mass index is 44 kg/m. Weight: (!) 159.7 kg (352 lb)   GENERAL: Alert, active, oriented x3  HEENT: Pupils equal reactive to light. Extraocular movements are intact. Sclera clear. Palpebral conjunctiva normal red color.Pharynx clear.  NECK: Supple with no palpable mass and no adenopathy.  LUNGS: Sound clear with no rales rhonchi or wheezes.  HEART: Regular rhythm S1 and S2 without murmur.  ABDOMEN: Soft and depressible, no palpable mass, no hepatomegaly. Pinpoint tenderness just on Tony same site of Tony scar of Tony previous ventral hernia repair.   EXTREMITIES: Well-developed well-nourished symmetrical with no dependent edema.  NEUROLOGICAL: Awake alert oriented, facial expression symmetrical, moving all extremities.  REVIEW OF DATA: I have reviewed Tony following data today:  Abdominal CT scan evaluated showing a small supra umbilical hernia with fat content.    ASSESSMENT: Tony West is a 37 y.o. male presenting for consultation for umbilical hernia. Upon physical exam and CT images, Tony West has a ventral hernia most likely a recurrence from a previous hernia since Tony pain is on Tony same site of Tony old scar. There is fat incarcerated as it was not able to be reduced due to Tony pain.   West oriented about what is a hernia, and its treatment alternatives. Surgical technique was explained to Tony West including mesh implantation. Risks of surgery were discussed with West including pain, seromas, hematomas, bowel injury, bowel obstruction, mesh problems among others. West also oriented about his higher risk of recurrence due to Tony obesity, smoking status, diabetes but due to Tony significant pain and that it is incarcerated and Tony defect is small will proceed to perform Tony repair.   PLAN: 1. Repair of incisional hernia with mesh 21308, [id number] 2. Avoid heavy lifting or  any movement that may aggravate Tony pain 3. CBC, CMP, Internal Medicine clearance 4. Follow staff instruction regarding Tony pre admission for surgery  West verbalized understanding, all questions were answered, and were agreeable with Tony plan outlined above.   Carolan Shiver, MD  Electronically signed by Carolan Shiver, MD

## 2017-04-02 ENCOUNTER — Inpatient Hospital Stay: Admission: RE | Admit: 2017-04-02 | Payer: Self-pay | Source: Ambulatory Visit

## 2017-04-06 ENCOUNTER — Other Ambulatory Visit: Payer: Self-pay

## 2017-04-06 ENCOUNTER — Encounter
Admission: RE | Admit: 2017-04-06 | Discharge: 2017-04-06 | Disposition: A | Discharge status: Home / Self Care | Payer: Medicaid Other | Source: Ambulatory Visit | Attending: General Surgery | Admitting: General Surgery

## 2017-04-06 DIAGNOSIS — Z01818 Encounter for other preprocedural examination: Secondary | ICD-10-CM | POA: Insufficient documentation

## 2017-04-06 DIAGNOSIS — E119 Type 2 diabetes mellitus without complications: Secondary | ICD-10-CM | POA: Diagnosis not present

## 2017-04-06 DIAGNOSIS — I10 Essential (primary) hypertension: Secondary | ICD-10-CM | POA: Diagnosis not present

## 2017-04-06 HISTORY — DX: Sleep apnea, unspecified: G47.30

## 2017-04-06 HISTORY — DX: Essential (primary) hypertension: I10

## 2017-04-06 LAB — GLUCOSE, RANDOM: Glucose, Bld: 372 mg/dL — ABNORMAL HIGH (ref 65–99)

## 2017-04-06 NOTE — Patient Instructions (Signed)
Your procedure is scheduled on: Tomorrow Report to Day Surgery. To find out your arrival time please call [phone] between 1PM - 3PM on Today.  Remember: Instructions that are not followed completely may result in serious medical risk, up to and including death, or upon the discretion of your surgeon and anesthesiologist your surgery may need to be rescheduled.     _X__ 1. Do not eat food after midnight the night before your procedure.                 No gum chewing or hard candies. You may drink clear liquids up to 2 hours                 before you are scheduled to arrive for your surgery- DO not drink clear                 liquids within 2 hours of the start of your surgery.                 Clear Liquids include:  water, apple juice without pulp, clear carbohydrate                 drink such as Clearfast of Gartorade, Black Coffee or Tea (Do not add                 anything to coffee or tea).  __X__2.  On the morning of surgery brush your teeth with toothpaste and water, you may rinse your mouth with mouthwash if you wish.  Do not swallow any  toothpaste of mouthwash.     _X__ 3.  No Alcohol for 24 hours before or after surgery.   _X__ 4.  Do Not Smoke or use e-cigarettes For 24 Hours Prior to Your Surgery.                 Do not use any chewable tobacco products for at least 6 hours prior to                 surgery.  ____  5.  Bring all medications with you on the day of surgery if instructed.   _x___  6.  Notify your doctor if there is any change in your medical condition      (cold, fever, infections).     Do not wear jewelry, make-up, hairpins, clips or nail polish. Do not wear lotions, powders, or perfumes. You may wear deodorant. Do not shave 48 hours prior to surgery. Men may shave face and neck. Do not bring valuables to the hospital.    [facility] any belongings or valuables.  Contacts, dentures or bridgework may not  be worn into surgery. Leave your suitcase in the car. After surgery it may be brought to your room. For patients admitted to the hospital, discharge time is determined by your treatment team.   Patients discharged the day of surgery will not be allowed to drive home.   Please read over the following fact sheets that you were given:     __x__ Take these medicines the morning of surgery with A SIP OF WATER:    1. None  2.   3.   4.  5.  6.  ____ Fleet Enema (as directed)   __x__ Use CHG Soap as directed  ____ Use inhalers on the day of surgery  ____ Stop metformin 2 days prior to surgery    ____ Take 1/2 of  usual insulin dose the night before surgery. No insulin the morning          of surgery.   ____ Stop Coumadin/Plavix/aspirin on   __x__ Stop Anti-inflammatories on May take tylenol   ____ Stop supplements until after surgery.    ____ Bring C-Pap to the hospital.

## 2017-04-06 NOTE — Pre-Procedure Instructions (Signed)
Dr. Henrene HawkingKephart notified on BS results of 372.  He said ideally his diabetic control should be optimized before surgery.  He will be cancelled tomorrow if BS is 350 or above.  Dr. Hazle Quantintron Diaz nurse Irving BurtonEmily notified of BS and she will let me know how they want to proceed.

## 2017-04-07 ENCOUNTER — Encounter: Admission: RE | Payer: Self-pay | Source: Ambulatory Visit

## 2017-04-07 ENCOUNTER — Ambulatory Visit: Admission: RE | Admit: 2017-04-07 | Payer: Medicaid Other | Source: Ambulatory Visit | Admitting: General Surgery

## 2017-04-07 SURGERY — REPAIR, HERNIA, VENTRAL
Anesthesia: Choice

## 2017-05-03 ENCOUNTER — Inpatient Hospital Stay: Admission: RE | Admit: 2017-05-03 | Payer: Medicaid Other | Source: Ambulatory Visit

## 2017-05-05 MED ORDER — DEXTROSE 5 % IV SOLN
3.0000 g | INTRAVENOUS | Status: AC
  Administered 2017-05-06: 3 g via INTRAVENOUS
  Filled 2017-05-05: qty 3

## 2017-05-06 ENCOUNTER — Ambulatory Visit: Payer: Medicaid Other | Admitting: Registered Nurse

## 2017-05-06 ENCOUNTER — Encounter: Payer: Self-pay | Admitting: *Deleted

## 2017-05-06 ENCOUNTER — Ambulatory Visit
Admission: RE | Admit: 2017-05-06 | Discharge: 2017-05-06 | Disposition: A | Discharge status: Home / Self Care | Payer: Medicaid Other | Source: Ambulatory Visit | Attending: General Surgery | Admitting: General Surgery

## 2017-05-06 ENCOUNTER — Other Ambulatory Visit: Payer: Self-pay

## 2017-05-06 ENCOUNTER — Encounter
Admission: RE | Disposition: A | Discharge status: Home / Self Care | Payer: Self-pay | Source: Ambulatory Visit | Attending: General Surgery

## 2017-05-06 DIAGNOSIS — Z794 Long term (current) use of insulin: Secondary | ICD-10-CM | POA: Insufficient documentation

## 2017-05-06 DIAGNOSIS — E119 Type 2 diabetes mellitus without complications: Secondary | ICD-10-CM | POA: Diagnosis not present

## 2017-05-06 DIAGNOSIS — G4730 Sleep apnea, unspecified: Secondary | ICD-10-CM | POA: Diagnosis not present

## 2017-05-06 DIAGNOSIS — I10 Essential (primary) hypertension: Secondary | ICD-10-CM | POA: Insufficient documentation

## 2017-05-06 DIAGNOSIS — F17200 Nicotine dependence, unspecified, uncomplicated: Secondary | ICD-10-CM | POA: Diagnosis not present

## 2017-05-06 DIAGNOSIS — Z6841 Body Mass Index (BMI) 40.0 and over, adult: Secondary | ICD-10-CM | POA: Diagnosis not present

## 2017-05-06 DIAGNOSIS — K430 Incisional hernia with obstruction, without gangrene: Secondary | ICD-10-CM | POA: Insufficient documentation

## 2017-05-06 HISTORY — PX: VENTRAL HERNIA REPAIR: SHX424

## 2017-05-06 LAB — GLUCOSE, CAPILLARY
GLUCOSE-CAPILLARY: 110 mg/dL — AB (ref 65–99)
Glucose-Capillary: 154 mg/dL — ABNORMAL HIGH (ref 65–99)

## 2017-05-06 SURGERY — REPAIR, HERNIA, VENTRAL
Anesthesia: General | Wound class: Clean

## 2017-05-06 MED ORDER — FENTANYL CITRATE (PF) 100 MCG/2ML IJ SOLN
25.0000 ug | INTRAMUSCULAR | Status: DC | PRN
  Administered 2017-05-06 (×3): 25 ug via INTRAVENOUS

## 2017-05-06 MED ORDER — BUPIVACAINE-EPINEPHRINE 0.5% -1:200000 IJ SOLN
INTRAMUSCULAR | Status: DC | PRN
Start: 2017-05-06 — End: 2017-05-06
  Administered 2017-05-06: 30 mL

## 2017-05-06 MED ORDER — HYDROCODONE-ACETAMINOPHEN 5-325 MG PO TABS: 1.0000 | ORAL_TABLET | ORAL | 0 refills | Status: AC | PRN

## 2017-05-06 MED ORDER — FAMOTIDINE 20 MG PO TABS: 20.0000 mg | ORAL_TABLET | Freq: Once | ORAL | Status: DC

## 2017-05-06 MED ORDER — SODIUM CHLORIDE 0.9 % IV SOLN
INTRAVENOUS | Status: DC
  Administered 2017-05-06: 10:00:00 via INTRAVENOUS

## 2017-05-06 MED ORDER — PROPOFOL 10 MG/ML IV BOLUS
INTRAVENOUS | Status: DC | PRN
  Administered 2017-05-06: 250 mg via INTRAVENOUS

## 2017-05-06 MED ORDER — LIDOCAINE HCL (PF) 2 % IJ SOLN
INTRAMUSCULAR | Status: AC
Start: 2017-05-06 — End: ?
  Filled 2017-05-06: qty 20

## 2017-05-06 MED ORDER — MIDAZOLAM HCL 2 MG/2ML IJ SOLN
INTRAMUSCULAR | Status: DC | PRN
  Administered 2017-05-06: 2 mg via INTRAVENOUS

## 2017-05-06 MED ORDER — ROCURONIUM BROMIDE 100 MG/10ML IV SOLN
INTRAVENOUS | Status: DC | PRN
  Administered 2017-05-06: 20 mg via INTRAVENOUS
  Administered 2017-05-06: 30 mg via INTRAVENOUS

## 2017-05-06 MED ORDER — PHENYLEPHRINE HCL 10 MG/ML IJ SOLN
INTRAMUSCULAR | Status: DC | PRN
  Administered 2017-05-06: 25 ug/min via INTRAVENOUS

## 2017-05-06 MED ORDER — DEXAMETHASONE SODIUM PHOSPHATE 10 MG/ML IJ SOLN
INTRAMUSCULAR | Status: AC
  Filled 2017-05-06: qty 1

## 2017-05-06 MED ORDER — PROPOFOL 10 MG/ML IV BOLUS
INTRAVENOUS | Status: AC
Start: 2017-05-06 — End: ?
  Filled 2017-05-06: qty 20

## 2017-05-06 MED ORDER — SUGAMMADEX SODIUM 500 MG/5ML IV SOLN
INTRAVENOUS | Status: AC
Start: 2017-05-06 — End: ?
  Filled 2017-05-06: qty 5

## 2017-05-06 MED ORDER — SUGAMMADEX SODIUM 500 MG/5ML IV SOLN
INTRAVENOUS | Status: DC | PRN
  Administered 2017-05-06: 300 mg via INTRAVENOUS

## 2017-05-06 MED ORDER — PROPOFOL 10 MG/ML IV BOLUS
INTRAVENOUS | Status: AC
  Filled 2017-05-06: qty 20

## 2017-05-06 MED ORDER — ONDANSETRON HCL 4 MG/2ML IJ SOLN: 4.0000 mg | Freq: Once | INTRAMUSCULAR | Status: DC | PRN

## 2017-05-06 MED ORDER — PHENYLEPHRINE HCL 10 MG/ML IJ SOLN
INTRAMUSCULAR | Status: DC | PRN
  Administered 2017-05-06: 200 ug via INTRAVENOUS
  Administered 2017-05-06 (×2): 100 ug via INTRAVENOUS
  Administered 2017-05-06 (×2): 200 ug via INTRAVENOUS

## 2017-05-06 MED ORDER — SUCCINYLCHOLINE CHLORIDE 20 MG/ML IJ SOLN
INTRAMUSCULAR | Status: DC | PRN
  Administered 2017-05-06: 140 mg via INTRAVENOUS

## 2017-05-06 MED ORDER — ONDANSETRON HCL 4 MG/2ML IJ SOLN
INTRAMUSCULAR | Status: DC | PRN
  Administered 2017-05-06: 4 mg via INTRAVENOUS

## 2017-05-06 MED ORDER — LIDOCAINE HCL (CARDIAC) 20 MG/ML IV SOLN
INTRAVENOUS | Status: DC | PRN
  Administered 2017-05-06: 100 mg via INTRAVENOUS

## 2017-05-06 MED ORDER — FENTANYL CITRATE (PF) 250 MCG/5ML IJ SOLN
INTRAMUSCULAR | Status: AC
  Filled 2017-05-06: qty 5

## 2017-05-06 MED ORDER — ACETAMINOPHEN 10 MG/ML IV SOLN
INTRAVENOUS | Status: AC
  Filled 2017-05-06: qty 100

## 2017-05-06 MED ORDER — ACETAMINOPHEN 10 MG/ML IV SOLN
INTRAVENOUS | Status: DC | PRN
  Administered 2017-05-06: 1000 mg via INTRAVENOUS

## 2017-05-06 MED ORDER — FENTANYL CITRATE (PF) 100 MCG/2ML IJ SOLN
INTRAMUSCULAR | Status: AC
  Filled 2017-05-06: qty 2

## 2017-05-06 MED ORDER — MIDAZOLAM HCL 2 MG/2ML IJ SOLN
INTRAMUSCULAR | Status: AC
  Filled 2017-05-06: qty 2

## 2017-05-06 MED ORDER — BUPIVACAINE-EPINEPHRINE (PF) 0.5% -1:200000 IJ SOLN
INTRAMUSCULAR | Status: AC
  Filled 2017-05-06: qty 30

## 2017-05-06 MED ORDER — FENTANYL CITRATE (PF) 100 MCG/2ML IJ SOLN
INTRAMUSCULAR | Status: DC | PRN
  Administered 2017-05-06 (×2): 50 ug via INTRAVENOUS

## 2017-05-06 SURGICAL SUPPLY — 30 items
BLADE SURG 15 STRL LF DISP TIS (BLADE) ×1 IMPLANT
BLADE SURG 15 STRL SS (BLADE) ×2
CANISTER SUCT 1200ML W/VALVE (MISCELLANEOUS) ×3 IMPLANT
CHLORAPREP W/TINT 26ML (MISCELLANEOUS) ×3 IMPLANT
DERMABOND ADVANCED (GAUZE/BANDAGES/DRESSINGS) ×2
DERMABOND ADVANCED .7 DNX12 (GAUZE/BANDAGES/DRESSINGS) ×1 IMPLANT
DRAPE LAPAROTOMY 100X77 ABD (DRAPES) ×3 IMPLANT
ELECT REM PT RETURN 9FT ADLT (ELECTROSURGICAL) ×3
ELECTRODE REM PT RTRN 9FT ADLT (ELECTROSURGICAL) ×1 IMPLANT
GAUZE SPONGE 4X4 12PLY STRL (GAUZE/BANDAGES/DRESSINGS) IMPLANT
GLOVE BIO SURGEON STRL SZ 6.5 (GLOVE) ×2 IMPLANT
GLOVE BIO SURGEONS STRL SZ 6.5 (GLOVE) ×1
GLOVE BIOGEL PI IND STRL 6.5 (GLOVE) ×1 IMPLANT
GLOVE BIOGEL PI INDICATOR 6.5 (GLOVE) ×2
GOWN STRL REUS W/ TWL LRG LVL3 (GOWN DISPOSABLE) ×1 IMPLANT
GOWN STRL REUS W/TWL LRG LVL3 (GOWN DISPOSABLE) ×2
KIT TURNOVER KIT A (KITS) ×3 IMPLANT
LABEL OR SOLS (LABEL) ×3 IMPLANT
NEEDLE HYPO 25X1 1.5 SAFETY (NEEDLE) ×3 IMPLANT
NS IRRIG 500ML POUR BTL (IV SOLUTION) ×3 IMPLANT
PACK BASIN MINOR ARMC (MISCELLANEOUS) ×3 IMPLANT
SUT MNCRL 4-0 (SUTURE) ×2
SUT MNCRL 4-0 27XMFL (SUTURE) ×1
SUT PDS PLUS 0 (SUTURE) ×2
SUT PDS PLUS AB 0 CT-2 (SUTURE) ×1 IMPLANT
SUT PROLENE 0 CT 2 (SUTURE) ×6 IMPLANT
SUT VIC AB 2-0 SH 27 (SUTURE) ×2
SUT VIC AB 2-0 SH 27XBRD (SUTURE) ×1 IMPLANT
SUTURE MNCRL 4-0 27XMF (SUTURE) ×1 IMPLANT
SYR 10ML LL (SYRINGE) ×3 IMPLANT

## 2017-05-06 NOTE — Op Note (Signed)
Preoperative diagnosis: Ventral (incisional) incercerated hernia.  Postoperative diagnosis: Ventral (incisional) incarcerated hernia.  Procedure: Incisional hernia repair                     Insertion of mesh  Anesthesia: GETA  Surgeon: Dr. Hazle Quantintron Diaz  Wound Classification: Clean  Indications:Patient is a 37 y.o. male developed a ventral hernia in the site of a previous incision from a previous supra umbilical hernia repair. This was symptomatic and incarcerated and repair was indicated.   Findings: 1. A 1cm x 2cm incisional hernia 2. A 5cm x 5cm Ventrio ST mesh used for repair 3. No hollow viscus organ injury identified during procedure 4. Tension free repair achieved 5. Adequate hemostasis  Description of procedure: The patient was brought to the operating room and general anesthesia was induced. A time-out was completed verifying correct patient, procedure, site, positioning, and implant(s) and/or special equipment prior to beginning this procedure. Antibiotics were administered prior to making the incision. The anterior abdominal wall was prepped and draped in the standard sterile fashion. A vertical midline incision was made above the palpable hernia. The incision was deepened to the fascia. The hernia sac was then identified and dissected free from the adhesions. Once the adhesions were liberated, the hernia content with the sac was able to be reduced. The fascia was carefully palpated and no additional defects were identified. Adhesions to the underside of the abdominal wall were lysed and the fascia was assessed.  A piece of Ventrio mesh was placed in the pre peritoneal space and fixed circumferentially to the anterior fascia with multiple prolene sutures. The anterior fascia was then closed with figure of 8 prolene 0 suture on midline. Dead space was irrigated and approximated with 2-0 vicryl and subdermal tissue closed with 2-0 Vicryl and the skin was closed with subcuticular  sutures of Monocryl 4-0.  The patient tolerated the procedure well and was brought to the postanesthesia care unit in stable condition.   Specimen: None  Complications: None  Estimated Blood Loss: 5 mL

## 2017-05-06 NOTE — Brief Op Note (Signed)
05/06/2017  12:00 PM  PATIENT:  Tony AusLorenzo B Papa Sr.  37 y.o. male  PRE-OPERATIVE DIAGNOSIS:  hernia incarcerated ventral (incisional)  POST-OPERATIVE DIAGNOSIS:  hernia incarcerated ventral (incisional)  PROCEDURE:  Procedure(s): HERNIA REPAIR VENTRAL ADULT (N/A)  SURGEON:  Surgeon(s) and Role:    * Carolan Shiverintron-Diaz, Merion Grimaldo, MD - Primary  ANESTHESIA:   general  EBL:  5 mL

## 2017-05-06 NOTE — Anesthesia Postprocedure Evaluation (Signed)
Anesthesia Post Note  Patient: Rusty AusLorenzo B Lippard Sr.  Procedure(s) Performed: HERNIA REPAIR VENTRAL ADULT (N/A )  Patient location during evaluation: PACU Anesthesia Type: General Level of consciousness: awake and alert and oriented Pain management: pain level controlled Vital Signs Assessment: post-procedure vital signs reviewed and stable Respiratory status: spontaneous breathing Cardiovascular status: blood pressure returned to baseline Anesthetic complications: no     Last Vitals:  Vitals:   05/06/17 1300 05/06/17 1346  BP: 125/82 114/71  Pulse: 81 85  Resp: 16 16  Temp: (!) 36.1 C   SpO2: 92% 96%    Last Pain:  Vitals:   05/06/17 1346  TempSrc:   PainSc: 0-No pain                 Khaliyah Northrop

## 2017-05-06 NOTE — H&P (Signed)
PATIENT PROFILE: Tony West.is a 37 y.o.malewho presents to theClinic for consultation at the request Tony RighterofKrishna West NPfor evaluation of umbilical hernia.  ION:[id number]:[facility]  HISTORY OF PRESENT ILLNESS: TonyRuddreportshe continue feeling a lump above the umbilicus since October 2018. The patient refers that the pain continue on the area above the umbilicus. The pain is aggravated with anything that increase his abdominal pressure (sitting down, doing heavy lifting, eating) and with direct palpation on the area. Resting improves the pain. Pain does not radiates to other part of the body. Patient denies nausea or vomiting. Patient had surgery on the same area of pain on 2013.He was scheduled to have surgery 2 weeks ago the the blood sugar was uncontrolled. Primary care adjusted the diabetes medications and patient has had the blood sugar controlled very well. Today is is 110.    PROBLEM LIST:  Incisional hernia Hypertension Morbid obesity Diabetes mellitus Smoker  GENERAL REVIEW OF SYSTEMS:  General ROS: negative for - chills, fatigue, fever, weight gain or weight loss Allergy and Immunology ROS: negative for - hives  Hematological and Lymphatic ROS: negative for - bleeding problems or bruising, negative for palpable nodes Endocrine ROS: negative for - heat or cold intolerance, hair changes Respiratory ROS: negative for - cough, shortness of breath or wheezing Cardiovascular ROS: no chest pain or palpitations GI GUY:QIHKVQQVZDGROS:Positivefor nausea, , abdominal pain,Negative fordiarrhea, constipation,vomiting Musculoskeletal ROS: negative for - joint swelling or muscle pain Neurological ROS: negative for - confusion, syncope Dermatological ROS: negative for pruritus and rash Psychiatric: negative for anxiety, depression, difficulty sleeping and memory loss  MEDICATIONS: CurrentMedications        Current Outpatient Medications   Medication Sig Dispense Refill  . insulin GLARGINE (LANTUS) injection (concentration 100 units/mL) Inject subcutaneously every morning    . metFORMIN (GLUCOPHAGE) 500 MG tablet Take 500 mg by mouth once daily     No current facility-administered medications for this visit.      ALLERGIES: Shellfish containing products  PAST MEDICAL HISTORY:     Past Medical History:  Diagnosis Date  . Diabetes mellitus type 2, uncomplicated (CMS-HCC)   Hypertension Active smoker Hyperlipidemia Morbid Obesity  PAST SURGICAL HISTORY:      Past Surgical History:  Procedure Laterality Date  . HERNIA REPAIR  2013    FAMILY HISTORY: History reviewed. No pertinent family history.  SOCIAL HISTORY: Social History        Socioeconomic History  . Marital status: Single    Spouse name: Not on file  . Number of children: Not on file  . Years of education: Not on file  . Highest education level: Not on file  Occupational History  . Not on file  Social Needs  . Financial resource strain: Not on file  . Food insecurity:    Worry: Not on file    Inability: Not on file  . Transportation needs:    Medical: Not on file    Non-medical: Not on file  Tobacco Use  . Smoking status: Current Some Day Smoker  . Smokeless tobacco: Never Used  Substance and Sexual Activity  . Alcohol use: Yes    Comment: occasionally   . Drug use: Never  . Sexual activity: Not on file  Lifestyle  . Physical activity:    Days per week: Not on file    Minutes per session: Not on file  . Stress: Not on file  Relationships  . Social connections:    Talks on phone: Not on file  Gets together: Not on file    Attends religious service: Not on file    Active member of club or organization: Not on file    Attends meetings of clubs or organizations: Not on file    Relationship status: Not on file  . Intimate partner violence:    Fear of current or ex  partner: Not on file    Emotionally abused: Not on file    Physically abused: Not on file    Forced sexual activity: Not on file  Other Topics Concern  . Not on file  Social History Narrative  . Not on file    PHYSICAL EXAM:    Vitals:   03/30/17 1518  BP: (!) 141/91  Pulse: 89  Temp: 36.4 C (97.6 F)   Body mass index is 44 kg/m. Weight: (!) 159.7 kg (352 lb)  GENERAL: Alert, active, oriented x3  HEENT: Pupils equal reactive to light. Extraocular movements are intact. Sclera clear. Palpebral conjunctiva normal red color.Pharynx clear.  NECK: Supple with no palpable mass and no adenopathy.  LUNGS: Sound clear with no rales rhonchi or wheezes.  HEART: Regular rhythm S1 and S2 without murmur.  ABDOMEN: Soft and depressible, no palpable mass, no hepatomegaly.Pinpoint tenderness just on the same site of the scar of the previous ventral hernia repair.  EXTREMITIES: Well-developed well-nourished symmetrical with no dependent edema.  NEUROLOGICAL: Awake alert oriented, facial expression symmetrical, moving all extremities.  REVIEW OF DATA: I have reviewed the following data today:  Abdominal CT scan evaluated showing a small supra umbilical hernia with fat content.  ASSESSMENT: TonyRuddis a 37 y.o.malepresenting for consultation for umbilical hernia.Upon physical exam and CT images, the patient has a ventral hernia most likely a recurrence from a previous hernia since the pain is on the same site of the old scar. There is fat incarcerated as it was not able to be reduced due to the pain.   Patient oriented about what is a hernia, and its treatment alternatives. Surgical technique was explained to the patient including mesh implantation. Risks of surgery were discussed with patient including pain, seromas, hematomas, bowel injury, bowel obstruction, mesh problems among others. Patient again oriented about his higher risk of recurrence due to the  obesity, smoking status, diabetes (now better controlled) but due to the significant pain and that it is incarcerated and the defect is small will proceed to perform the repair.  PLAN: 1. Repair of incisional hernia with mesh [id number], [id number] 2. Avoid heavy lifting or any movement that may aggravate the pain 3. CBC, CMP, Internal Medicine clearance 4. Follow staff instruction regarding the pre admission for surgery  Patient verbalized understanding, all questions were answered, and were agreeable with the plan outlined above.   Carolan Shiver, MD  Electronically signed by Carolan Shiver, MD

## 2017-05-06 NOTE — Anesthesia Procedure Notes (Signed)
Procedure Name: Intubation Date/Time: 05/06/2017 9:55 AM Performed by: Yves Dillarroll, Paul, MD Pre-anesthesia Checklist: Patient identified, Emergency Drugs available, Suction available, Patient being monitored and Timeout performed Patient Re-evaluated:Patient Re-evaluated prior to induction Oxygen Delivery Method: Circle system utilized Preoxygenation: Pre-oxygenation with 100% oxygen Induction Type: IV induction, Rapid sequence and Cricoid Pressure applied Ventilation: Mask ventilation without difficulty Laryngoscope Size: McGraph and 4 Grade View: Grade I Tube type: Oral Tube size: 7.5 mm Number of attempts: 1 Airway Equipment and Method: Patient positioned with wedge pillow,  Stylet and Video-laryngoscopy Placement Confirmation: ETT inserted through vocal cords under direct vision,  positive ETCO2,  CO2 detector and breath sounds checked- equal and bilateral Secured at: 23 cm Tube secured with: Tape Dental Injury: Teeth and Oropharynx as per pre-operative assessment

## 2017-05-06 NOTE — Transfer of Care (Signed)
Immediate Anesthesia Transfer of Care Note  Patient: Tony AusLorenzo B Mathwig Sr.  Procedure(s) Performed: HERNIA REPAIR VENTRAL ADULT (N/A )  Patient Location: PACU  Anesthesia Type:General  Level of Consciousness: drowsy  Airway & Oxygen Therapy: Patient Spontanous Breathing and Patient connected to face mask oxygen  Post-op Assessment: Report given to RN and Post -op Vital signs reviewed and stable  Post vital signs: Reviewed and stable  Last Vitals:  Vitals Value Taken Time  BP 150/71 05/06/2017 11:46 AM  Temp 36.7 C 05/06/2017 11:46 AM  Pulse 97 05/06/2017 11:47 AM  Resp 16 05/06/2017 11:47 AM  SpO2 93 % 05/06/2017 11:47 AM  Vitals shown include unvalidated device data.  Last Pain:  Vitals:   05/06/17 1146  TempSrc: Tympanic         Complications: No apparent anesthesia complications

## 2017-05-06 NOTE — Anesthesia Preprocedure Evaluation (Signed)
Anesthesia Evaluation  Patient identified by MRN, date of birth, ID band Patient awake    Reviewed: Allergy & Precautions, NPO status , Patient's Chart, lab work & pertinent test results  Airway Mallampati: II  TM Distance: >3 FB     Dental  (+) Chipped   Pulmonary sleep apnea , Current Smoker,    Pulmonary exam normal        Cardiovascular hypertension, Normal cardiovascular exam     Neuro/Psych negative neurological ROS  negative psych ROS   GI/Hepatic Neg liver ROS,   Endo/Other  diabetes, Well Controlled, Type 2, Oral Hypoglycemic AgentsMorbid obesity  Renal/GU negative Renal ROS  negative genitourinary   Musculoskeletal   Abdominal (+) + obese,   Peds negative pediatric ROS (+)  Hematology   Anesthesia Other Findings   Reproductive/Obstetrics                             Anesthesia Physical Anesthesia Plan  ASA: III  Anesthesia Plan: General   Post-op Pain Management:    Induction: Intravenous, Rapid sequence and Cricoid pressure planned  PONV Risk Score and Plan:   Airway Management Planned: Oral ETT  Additional Equipment:   Intra-op Plan:   Post-operative Plan: Extubation in OR  Informed Consent: I have reviewed the patients History and Physical, chart, labs and discussed the procedure including the risks, benefits and alternatives for the proposed anesthesia with the patient or authorized representative who has indicated his/her understanding and acceptance.   Dental advisory given  Plan Discussed with: CRNA and Surgeon  Anesthesia Plan Comments:         Anesthesia Quick Evaluation

## 2017-05-06 NOTE — Discharge Instructions (Signed)
  Diet: Resume home heart healthy regular diet.   Activity: No heavy lifting >20 pounds (children, pets, laundry, garbage) or strenuous activity until follow-up, but light activity and walking are encouraged. Do not drive or drink alcohol if taking narcotic pain medications.  Wound care: May shower with soapy water and pat dry (do not rub incisions), but no baths or submerging incision underwater until follow-up. (no swimming)   Medications: Resume all home medications. For mild to moderate pain: acetaminophen (Tylenol) or ibuprofen (if no kidney disease). Combining Tylenol with alcohol can substantially increase your risk of causing liver disease. Narcotic pain medications, if prescribed, can be used for severe pain, though may cause nausea, constipation, and drowsiness. Do not combine Tylenol and Norco within a 6 hour period as Norco contains Tylenol. If you do not need the narcotic pain medication, you do not need to fill the prescription.  Call office ([phone]) at any time if any questions, worsening pain, fevers/chills, bleeding, drainage from incision site, or other concerns.   AMBULATORY SURGERY  DISCHARGE INSTRUCTIONS   1) The drugs that you were given will stay in your system until tomorrow so for the next 24 hours you should not:  A) Drive an automobile B) Make any legal decisions C) Drink any alcoholic beverage   2) You may resume regular meals tomorrow.  Today it is better to start with liquids and gradually work up to solid foods.  You may eat anything you prefer, but it is better to start with liquids, then soup and crackers, and gradually work up to solid foods.   3) Please notify your doctor immediately if you have any unusual bleeding, trouble breathing, redness and pain at the surgery site, drainage, fever, or pain not relieved by medication.    4) Additional Instructions:        Please contact your physician with any problems or Same Day Surgery at  [phone], Monday through Friday 6 am to 4 pm, or [place] [facility] [place] Main number at [phone]. 

## 2017-05-06 NOTE — Anesthesia Post-op Follow-up Note (Signed)
Anesthesia QCDR form completed.        

## 2017-06-11 ENCOUNTER — Emergency Department
Admission: EM | Admit: 2017-06-11 | Discharge: 2017-06-11 | Disposition: A | Discharge status: Home / Self Care | Payer: Medicaid Other | Attending: Emergency Medicine | Admitting: Emergency Medicine

## 2017-06-11 ENCOUNTER — Encounter: Payer: Self-pay | Admitting: Emergency Medicine

## 2017-06-11 DIAGNOSIS — S09302A Unspecified injury of left middle and inner ear, initial encounter: Secondary | ICD-10-CM

## 2017-06-11 DIAGNOSIS — F17210 Nicotine dependence, cigarettes, uncomplicated: Secondary | ICD-10-CM | POA: Diagnosis not present

## 2017-06-11 DIAGNOSIS — E119 Type 2 diabetes mellitus without complications: Secondary | ICD-10-CM | POA: Insufficient documentation

## 2017-06-11 DIAGNOSIS — I10 Essential (primary) hypertension: Secondary | ICD-10-CM | POA: Insufficient documentation

## 2017-06-11 DIAGNOSIS — T520X1A Toxic effect of petroleum products, accidental (unintentional), initial encounter: Secondary | ICD-10-CM | POA: Diagnosis not present

## 2017-06-11 DIAGNOSIS — Z794 Long term (current) use of insulin: Secondary | ICD-10-CM | POA: Insufficient documentation

## 2017-06-11 DIAGNOSIS — H918X2 Other specified hearing loss, left ear: Secondary | ICD-10-CM | POA: Diagnosis present

## 2017-06-11 MED ORDER — TETRACAINE HCL 0.5 % OP SOLN
1.0000 [drp] | Freq: Once | OPHTHALMIC | Status: AC
  Administered 2017-06-11: 1 [drp] via OPHTHALMIC
  Filled 2017-06-11 (×2): qty 4

## 2017-06-11 MED ORDER — FLUORESCEIN SODIUM 1 MG OP STRP
1.0000 | ORAL_STRIP | Freq: Once | OPHTHALMIC | Status: AC
  Administered 2017-06-11: 1 via OPHTHALMIC
  Filled 2017-06-11 (×2): qty 1

## 2017-06-11 MED ORDER — CIPROFLOXACIN HCL 0.3 % OP SOLN: OPHTHALMIC | 0 refills | Status: DC

## 2017-06-11 NOTE — ED Notes (Signed)
Assisted patient in the decon shower.
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
## 2017-06-11 NOTE — Discharge Instructions (Addendum)
Please seek medical attention for any high fevers, chest pain, shortness of breath, change in behavior, persistent vomiting, bloody stool or any other new or concerning symptoms.  

## 2017-06-11 NOTE — ED Notes (Signed)
Pt and family given juice and crackers and peanut butter due the fact they are diabetic. Offerred to check their blood sugar. Offer declined.
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
## 2018-02-04 IMAGING — CR DG SHOULDER 2+V*L*
3 series · 4 of 4 positions shown · non-contrast
Comparison: None.

CLINICAL DATA: Acute onset of left shoulder pain. Initial
encounter.

EXAM:
LEFT SHOULDER - 2+ VIEW

[shoulder grashey]
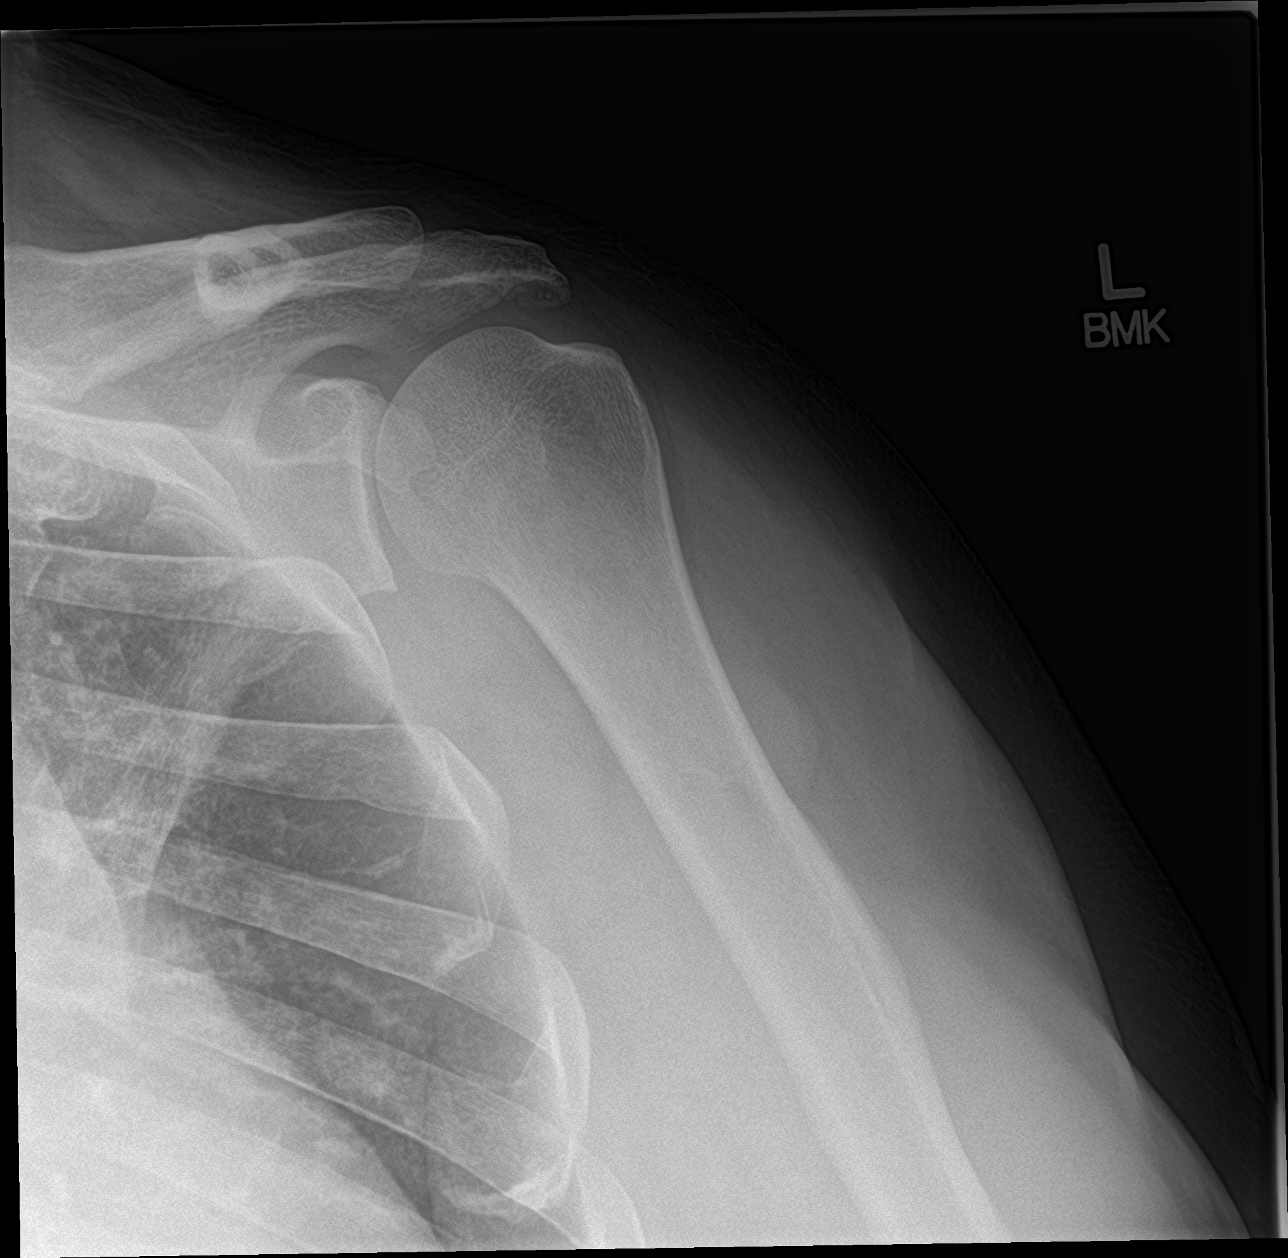

[shoulder y view]
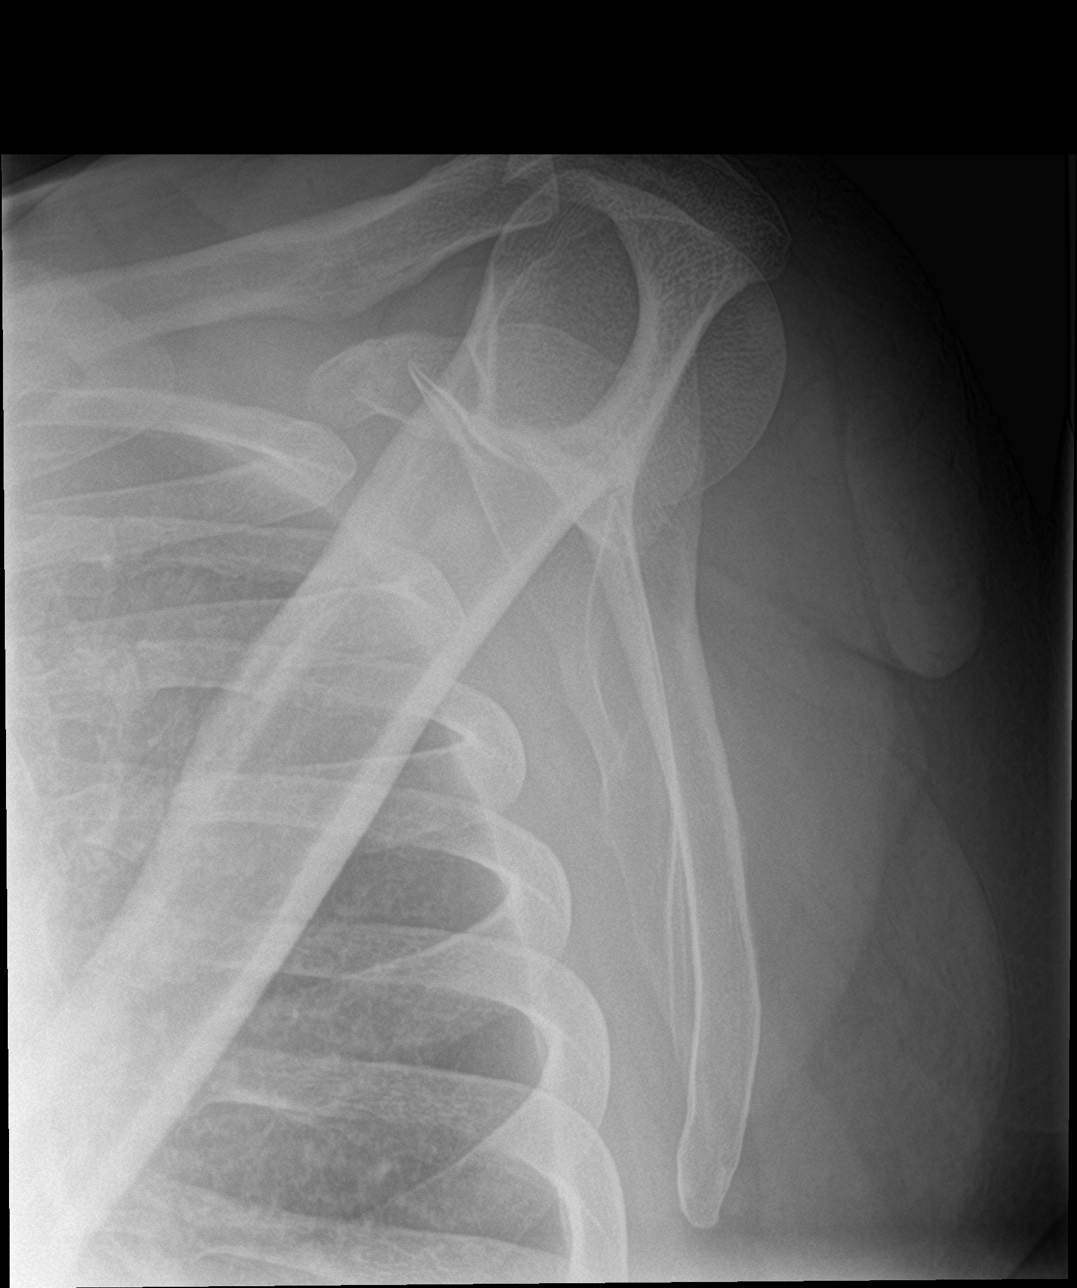

[Series 3: shoulder axillary · 0.14mm/px · 2 of 2 slices shown]
[im 1/2]
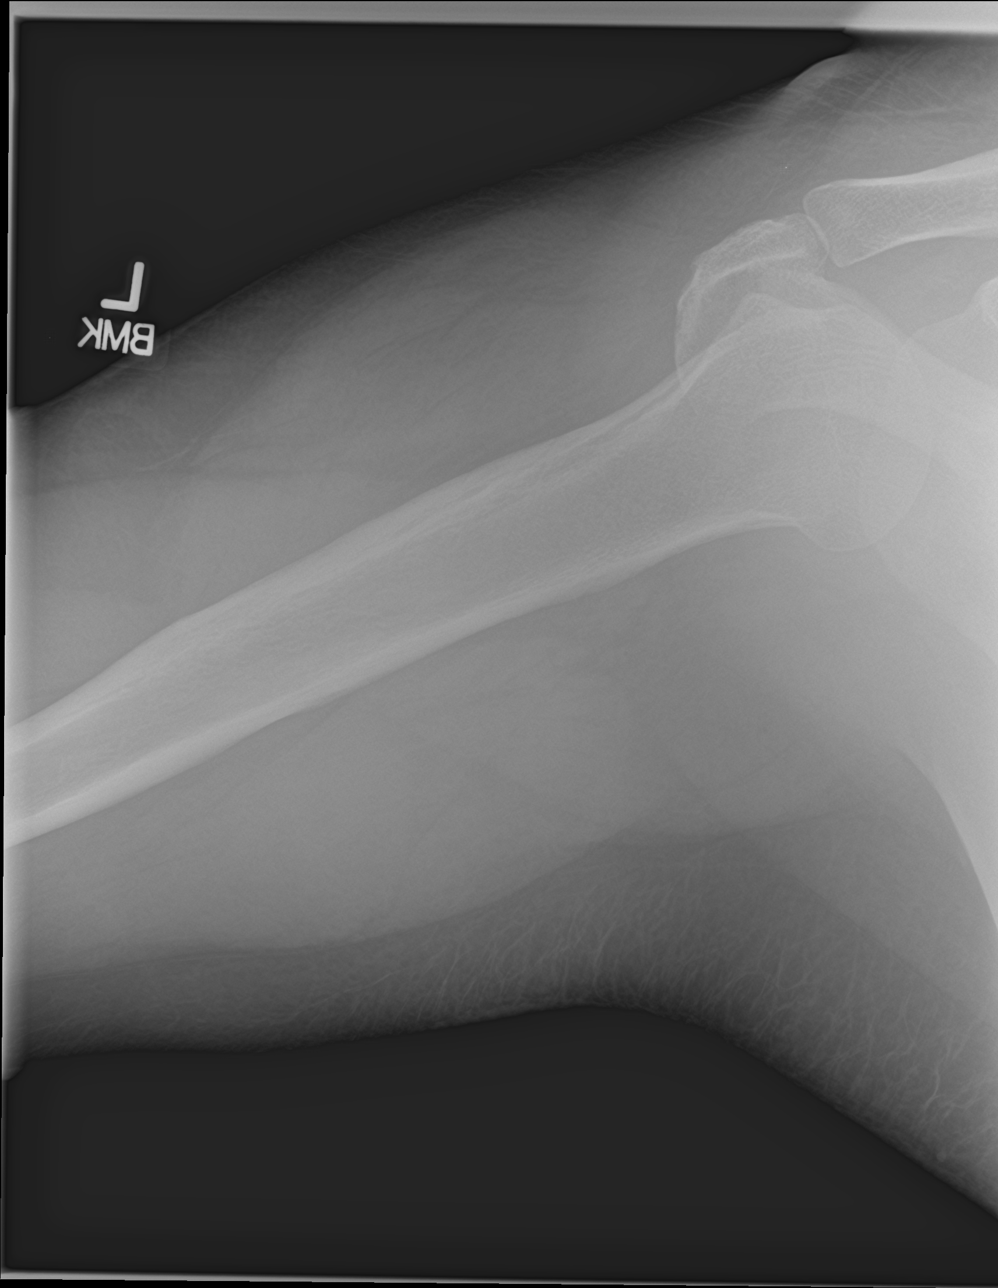
[im 2/2]
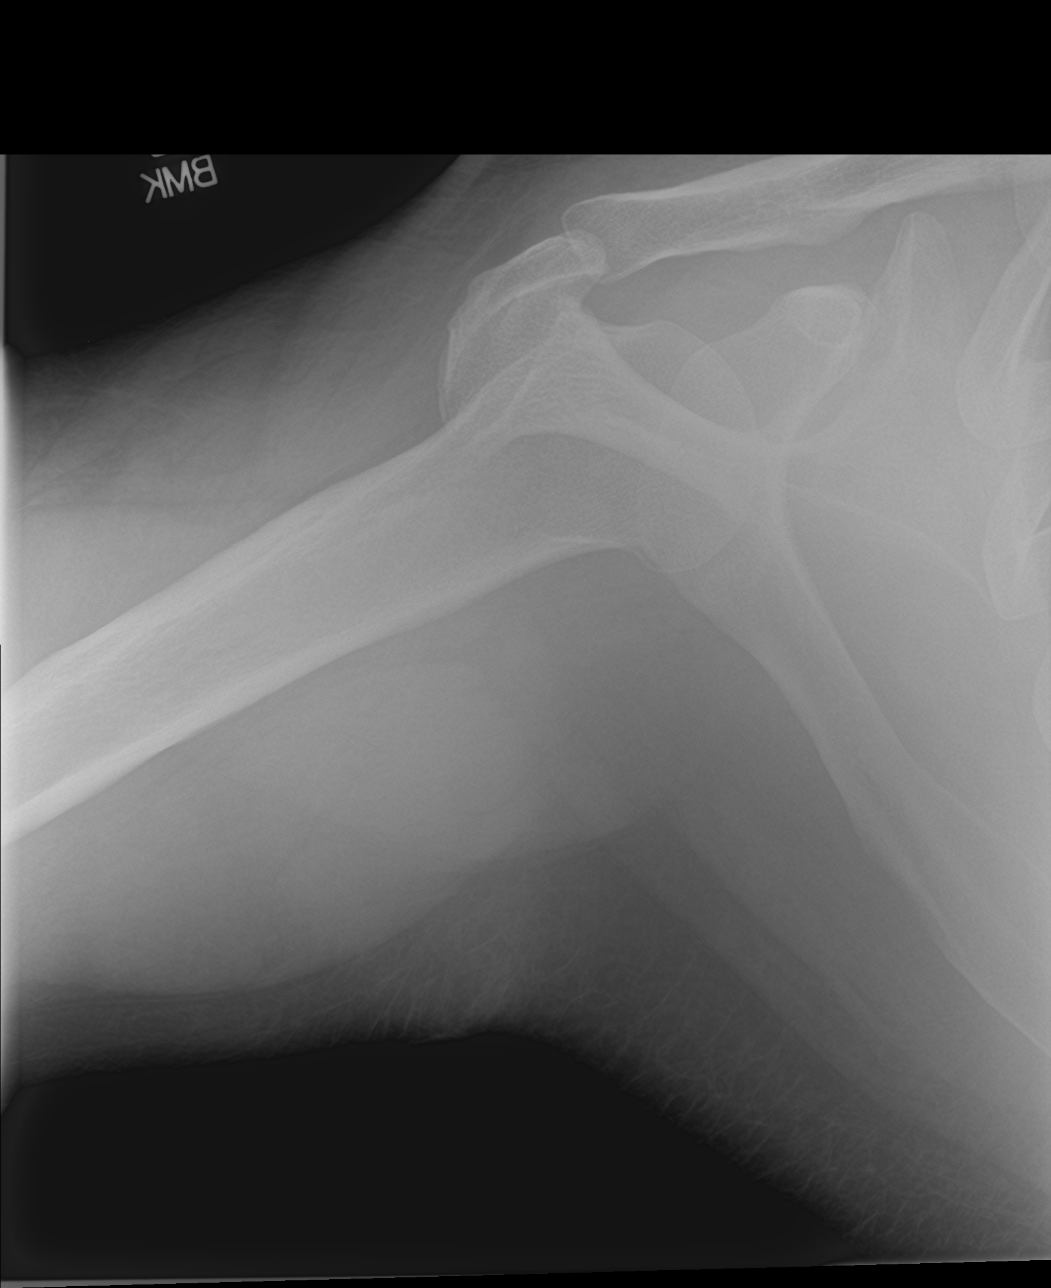

[4 of 4 positions shown; findings below may reference images not displayed]

FINDINGS: There is no evidence of fracture or dislocation. The left humeral
head is seated within the glenoid fossa. Minimal degenerative
sclerosis is noted at the left glenoid. The acromioclavicular joint
is unremarkable in appearance. No significant soft tissue
abnormalities are seen. The visualized portions of the left lung are
clear.
IMPRESSION: No evidence of fracture or dislocation.

## 2018-02-04 IMAGING — CR DG CERVICAL SPINE COMPLETE 4+V
1 series · 6 of 6 positions shown · non-contrast
Comparison: Soft tissue neck 06/23/2012

CLINICAL DATA: Left shoulder radiculopathy since the end [DATE].
Tingling with intermittent numbness in the left fingers.

EXAM:
CERVICAL SPINE - COMPLETE 4+ VIEW

[Series 1: dg cervical spine complete · 0.14mm/px · 6 of 6 slices shown]
[im 1/6]
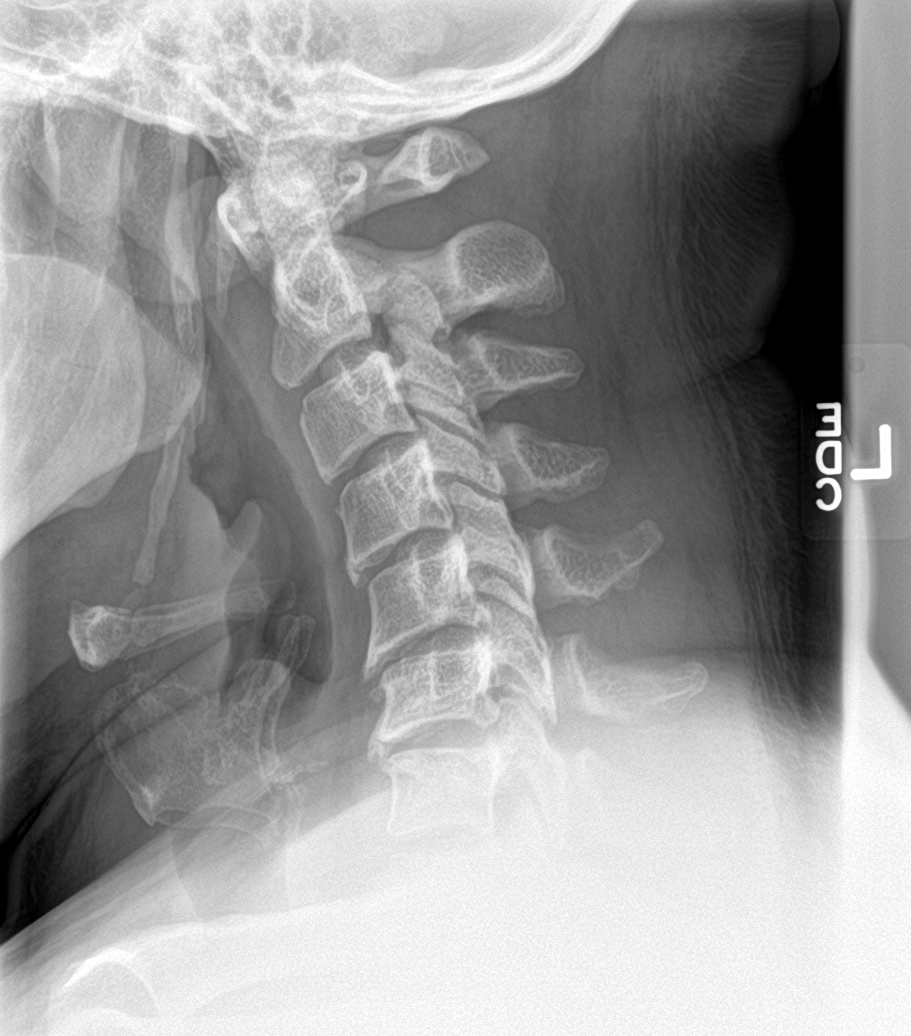
[im 2/6]
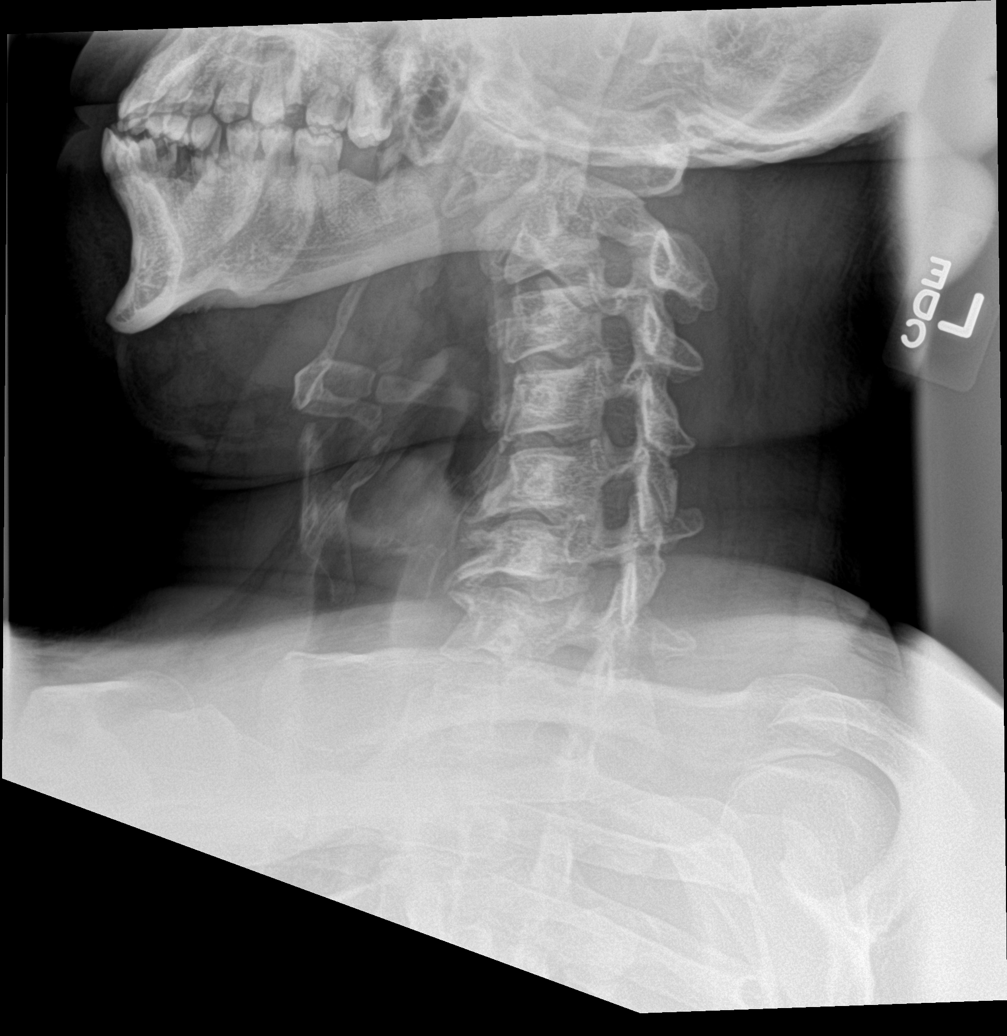
[im 3/6]
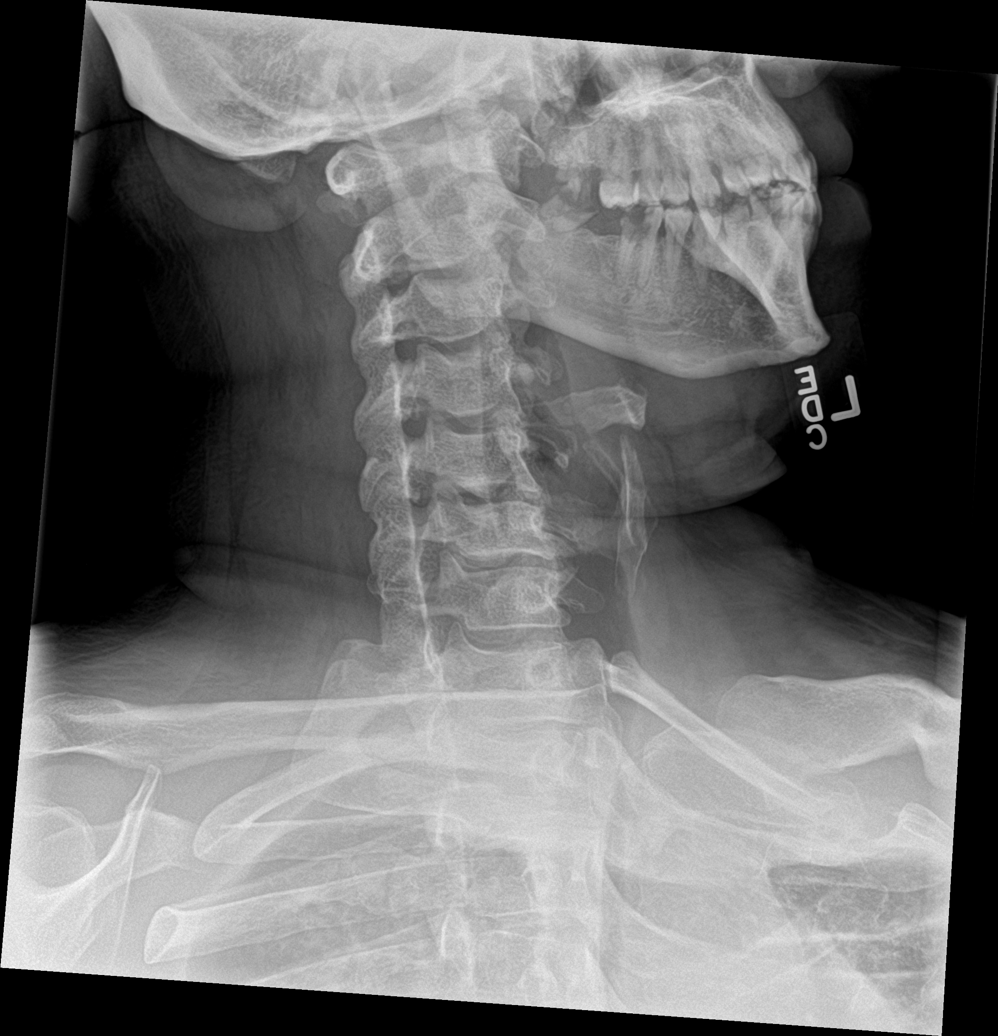
[im 4/6]
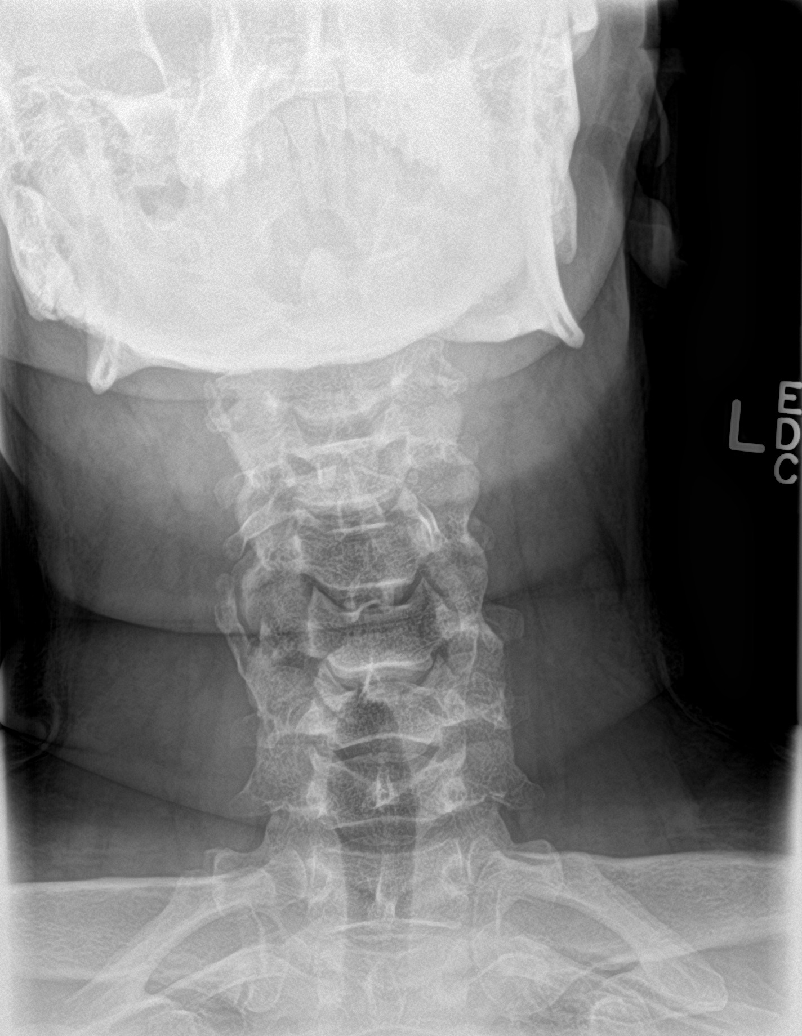
[im 5/6]
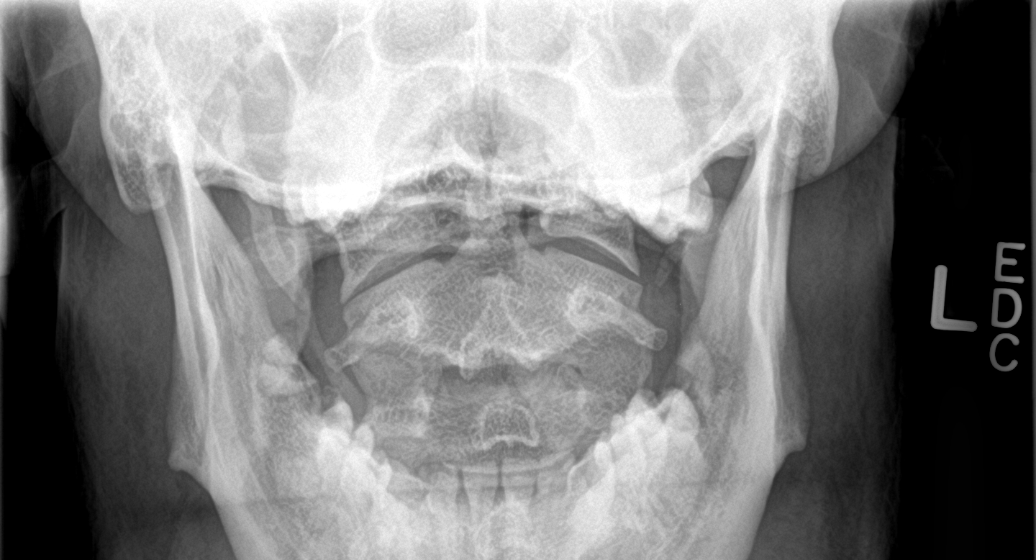
[im 6/6]
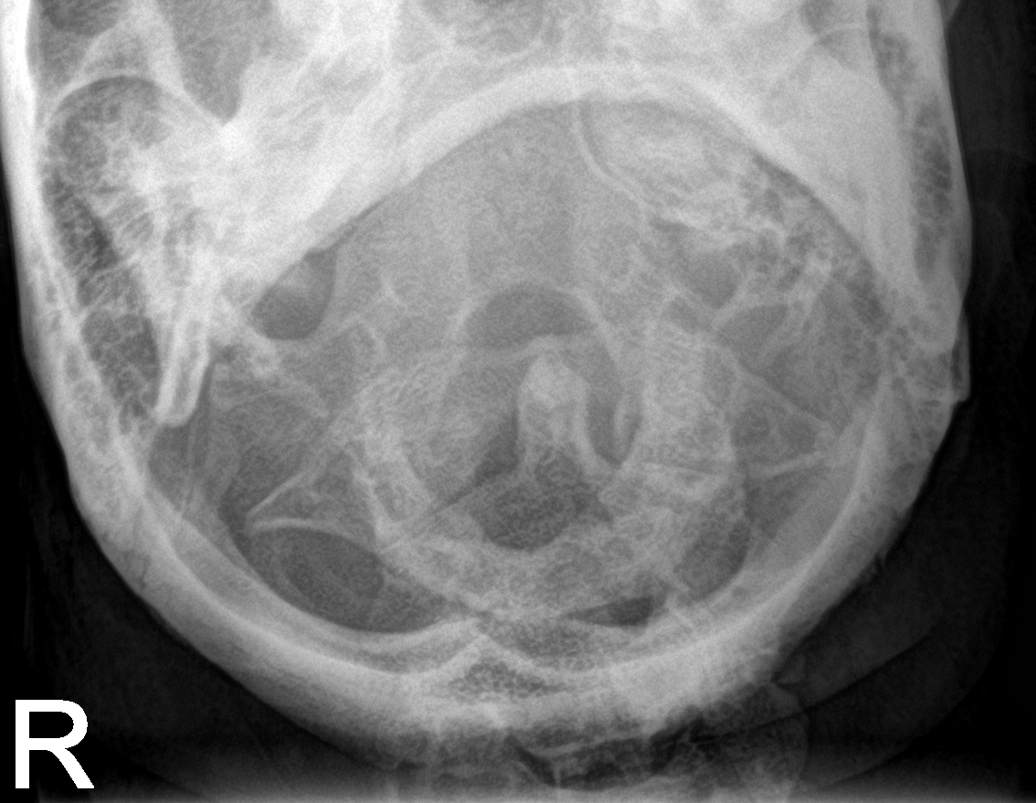

[6 of 6 positions shown; findings below may reference images not displayed]

FINDINGS: Reversal of the usual cervical lordosis without anterior
subluxation. This may be due to patient positioning but ligamentous
injury or muscle spasm could also have this appearance and are not
excluded. Normal alignment of the facet joints. No significant bone
encroachment upon the neural foramina. C1-2 articulation appears
intact. No vertebral compression deformities. No prevertebral soft
tissue swelling. No focal bone lesion or bone destruction.
Degenerative changes with disc space narrowing and end plate
hypertrophic changes at C4-5, C5-6, and C6-7 levels.
IMPRESSION: Nonspecific reversal of the usual cervical lordosis. Degenerative
changes in the cervical spine. No acute displaced fractures
identified.

## 2018-12-06 ENCOUNTER — Other Ambulatory Visit: Payer: Self-pay

## 2018-12-06 DIAGNOSIS — Z20822 Contact with and (suspected) exposure to covid-19: Secondary | ICD-10-CM

## 2018-12-07 LAB — NOVEL CORONAVIRUS, NAA: SARS-CoV-2, NAA: NOT DETECTED

## 2019-10-08 ENCOUNTER — Emergency Department
Admission: EM | Admit: 2019-10-08 | Discharge: 2019-10-08 | Discharge status: Against Medical Advice | Payer: Medicaid Other

## 2019-10-10 ENCOUNTER — Other Ambulatory Visit: Payer: Self-pay

## 2019-10-10 ENCOUNTER — Encounter: Payer: Self-pay | Admitting: Emergency Medicine

## 2019-10-10 ENCOUNTER — Ambulatory Visit
Admission: EM | Admit: 2019-10-10 | Discharge: 2019-10-10 | Disposition: A | Discharge status: Home / Self Care | Payer: Medicaid Other | Attending: Family Medicine | Admitting: Family Medicine

## 2019-10-10 DIAGNOSIS — W57XXXA Bitten or stung by nonvenomous insect and other nonvenomous arthropods, initial encounter: Secondary | ICD-10-CM

## 2019-10-10 DIAGNOSIS — T7840XA Allergy, unspecified, initial encounter: Secondary | ICD-10-CM | POA: Diagnosis not present

## 2019-10-10 MED ORDER — FAMOTIDINE 20 MG PO TABS: 20.0000 mg | ORAL_TABLET | Freq: Two times a day (BID) | ORAL | 0 refills | Status: DC

## 2019-10-10 MED ORDER — LORATADINE 10 MG PO TABS: 10.0000 mg | ORAL_TABLET | Freq: Every day | ORAL | 0 refills | Status: DC

## 2019-10-10 MED ORDER — EPINEPHRINE 0.3 MG/0.3ML IJ SOAJ
0.3000 mg | INTRAMUSCULAR | 0 refills | Status: AC | PRN
Start: 2019-10-10 — End: ?

## 2019-10-10 MED ORDER — PREDNISONE 10 MG PO TABS: ORAL_TABLET | ORAL | 0 refills | Status: AC

## 2019-10-10 NOTE — ED Provider Notes (Signed)
[facility]    CSN: [id number] Arrival date & time: 10/10/19  1225      History   Chief Complaint Chief Complaint  Patient presents with  . Insect Bite  . Oral Swelling    HPI Tony West is a 39 y.o. male.   HPI  Patient stung by wasps 2-3 days ago while he was mowing the yard. States the wasps started swarming him and he grabbed the water hose to spray them. Patient was subsequently stung on the bottom lip, back and back.  EMS evaluated patient as was given bendaryl.  Patient Tyelol, Claritin, Ibuprofen. Lip swelling goes down then it returns.  Never bitten by wasps before. Reports he vomited x1 and was short of breath. States he has an occasional "funny feeling" in the back of the throat. Denies chest pain, abdominal pain and headache.      Past Medical History:  Diagnosis Date  . Diabetes mellitus without complication (HCC)   . Hypertension   . Sleep apnea    CPAP prescribed but no longer using    There are no problems to display for this patient.   Past Surgical History:  Procedure Laterality Date  . HERNIA REPAIR     ventral with mesh  . INSERTION OF MESH    . VENTRAL HERNIA REPAIR N/A 05/06/2017   Procedure: HERNIA REPAIR VENTRAL ADULT;  Surgeon: Carolan Shiver, MD;  Location: [facility] ORS;  Service: General;  Laterality: N/A;       Home Medications    Prior to Admission medications   Medication Sig Start Date End Date Taking? Authorizing Provider  Empagliflozin (JARDIANCE PO) Take by mouth.   Yes [provider]  Insulin Glargine (LANTUS SOLOSTAR) 100 UNIT/ML Solostar Pen Inject 10 Units into the skin daily.   Yes [provider]  lisinopril (PRINIVIL,ZESTRIL) 5 MG tablet Take 5 mg by mouth daily.   Yes [provider]  ciprofloxacin (CILOXAN) 0.3 % ophthalmic solution Administer 2 drop, every 6 hours to the LEFT EAR for seven days 06/11/17   Phineas Semen, MD  EPINEPHrine 0.3 mg/0.3 mL IJ SOAJ injection  Inject 0.3 mLs (0.3 mg total) into the muscle as needed for anaphylaxis. 10/10/19   Dorsie Burich, Seward Meth, DO  famotidine (PEPCID) 20 MG tablet Take 1 tablet (20 mg total) by mouth 2 (two) times daily. 10/10/19   Makennah Omura, Seward Meth, DO  loratadine (CLARITIN) 10 MG tablet Take 1 tablet (10 mg total) by mouth daily. 10/10/19 11/09/19  Katha Cabal, DO  metFORMIN (GLUCOPHAGE) 500 MG tablet Take 500 mg by mouth daily.    [provider]  predniSONE (DELTASONE) 10 MG tablet Take 6 tablets (60 mg total) by mouth daily for 1 day, THEN 5 tablets (50 mg total) daily for 1 day, THEN 4 tablets (40 mg total) daily for 1 day, THEN 3 tablets (30 mg total) daily for 1 day, THEN 2 tablets (20 mg total) daily for 1 day, THEN 1 tablet (10 mg total) daily for 1 day. 10/10/19 10/16/19  Katha Cabal, DO    Family History Family History  Problem Relation Age of Onset  . Diabetes Mother   . Diabetes Father     Social History Social History   Tobacco Use  . Smoking status: Current Some Day Smoker    Packs/day: 0.50  . Smokeless tobacco: Never Used  Vaping Use  . Vaping Use: Some days  Substance Use Topics  . Alcohol use: No  . Drug use: No  Allergies   Shellfish allergy   Review of Systems Review of Systems See HPI    Physical Exam Triage Vital Signs ED Triage Vitals  Enc Vitals Group     BP 10/10/19 1335 (!) 145/92     Pulse Rate 10/10/19 1335 95     Resp 10/10/19 1335 18     Temp 10/10/19 1335 98.5 F (36.9 C)     Temp Source 10/10/19 1335 Oral     SpO2 10/10/19 1335 98 %     Weight 10/10/19 1333 (!) 365 lb (165.6 kg)     Height 10/10/19 1333 6\' 3"  (1.905 m)     Head Circumference --      Peak Flow --      Pain Score 10/10/19 1333 0     Pain Loc --      Pain Edu? --      Excl. in GC? --    No data found.  Updated Vital Signs BP (!) 145/92 (BP Location: Right Arm)   Pulse 95   Temp 98.5 F (36.9 C) (Oral)   Resp 18   Ht 6\' 3"  (1.905 m)   Wt (!) 365 lb (165.6 kg)   SpO2 98%    BMI 45.62 kg/m   Visual Acuity Right Eye Distance:   Left Eye Distance:   Bilateral Distance:    Right Eye Near:   Left Eye Near:    Bilateral Near:     Physical Exam Vitals and nursing note reviewed.  Constitutional:      General: He is not in acute distress.    Appearance: Normal appearance. He is obese. He is not ill-appearing, toxic-appearing or diaphoretic.  HENT:     Head: Normocephalic and atraumatic.     Right Ear: Tympanic membrane normal.     Left Ear: Tympanic membrane normal.     Nose: Nose normal.     Mouth/Throat:     Mouth: Mucous membranes are moist.     Pharynx: Oropharynx is clear. Posterior oropharyngeal erythema (mild) present. No oropharyngeal exudate.     Comments: Uvula mildly edematous and elongated, midline; normal lips  Eyes:     General:        Right eye: No discharge.        Left eye: No discharge.     Extraocular Movements: Extraocular movements intact.     Conjunctiva/sclera: Conjunctivae normal.     Pupils: Pupils are equal, round, and reactive to light.  Cardiovascular:     Rate and Rhythm: Normal rate and regular rhythm.     Pulses: Normal pulses.     Heart sounds: Normal heart sounds. No murmur heard.   Pulmonary:     Effort: Pulmonary effort is normal. No respiratory distress.     Breath sounds: Normal breath sounds.  Abdominal:     Palpations: Abdomen is soft.     Tenderness: There is no abdominal tenderness.     Comments: Obese abdomen   Musculoskeletal:        General: No swelling or tenderness. Normal range of motion.     Cervical back: Normal range of motion and neck supple. No rigidity or tenderness.  Lymphadenopathy:     Cervical: No cervical adenopathy.  Skin:    General: Skin is warm and dry.     Capillary Refill: Capillary refill takes less than 2 seconds.     Comments: multiple bites on bilateral upper back  Neurological:     Mental Status: He is alert  and oriented to person, place, and time. Mental status is at  baseline.  Psychiatric:        Mood and Affect: Mood normal.        Behavior: Behavior normal.       UC Treatments / Results  Labs (all labs ordered are listed, but only abnormal results are displayed) Labs Reviewed - No data to display  EKG   Radiology No results found.  Procedures Procedures (including critical care time)  Medications Ordered in UC Medications - No data to display  Initial Impression / Assessment and Plan / UC Course  I have reviewed the triage vital signs and the nursing notes.  Pertinent labs & imaging results that were available during my care of the patient were reviewed by me and considered in my medical decision making (see chart for details).    Allergic Reaction Patient 3 days post being bitten by wasps with intermittent sx. Patient VSS. BP mildly elevated otherwise VSS. Patient hadn't taken his HTN meds this morning. Overall patient is well appearing. Originally presented to ED via EMS due to c/f anaphylaxis but left due to long wait. Will treat with short course of steroids, Pepcid BID and Claritin daily.  EPI pen Rx provided.  Discussed EPI pen use and ED precautions.  Patient agrees with plan.    Final Clinical Impressions(s) / UC Diagnoses   Final diagnoses:  Allergic reaction, initial encounter  Insect bite, unspecified site, initial encounter     Discharge Instructions     You were seen at the Urgent Care after being stung by bees. Please pick up your prescriptions at your pharmacy. Be sure to take medications as directed. Use EPI pen if needed (as discussed). Follow up with your PCP as needed.    If you haven't already, sign up for My Chart to have easy access to your labs results, and communication with your primary care physician.  Dr. Rachael Darby      ED Prescriptions    Medication Sig Dispense Auth. Provider   predniSONE (DELTASONE) 10 MG tablet Take 6 tablets (60 mg total) by mouth daily for 1 day, THEN 5 tablets (50 mg  total) daily for 1 day, THEN 4 tablets (40 mg total) daily for 1 day, THEN 3 tablets (30 mg total) daily for 1 day, THEN 2 tablets (20 mg total) daily for 1 day, THEN 1 tablet (10 mg total) daily for 1 day. 21 tablet Quintan Saldivar, DO   famotidine (PEPCID) 20 MG tablet Take 1 tablet (20 mg total) by mouth 2 (two) times daily. 30 tablet Oney Folz, DO   loratadine (CLARITIN) 10 MG tablet Take 1 tablet (10 mg total) by mouth daily. 30 tablet Consuelo Thayne, DO   EPINEPHrine 0.3 mg/0.3 mL IJ SOAJ injection Inject 0.3 mLs (0.3 mg total) into the muscle as needed for anaphylaxis. 1 each Katha Cabal, DO     PDMP not reviewed this encounter.   Katha Cabal, DO 10/10/19 1544

## 2019-10-10 NOTE — Discharge Instructions (Addendum)
You were seen at the Urgent Care after being stung by bees. Please pick up your prescriptions at your pharmacy. Be sure to take medications as directed. Use EPI pen if needed (as discussed). Follow up with your PCP as needed.    If you haven't already, sign up for My Chart to have easy access to your labs results, and communication with your primary care physician.  Dr. Rachael Darby

## 2019-10-10 NOTE — ED Triage Notes (Signed)
Patient states he was stung by wasps 2 days ago. He went to the ER but was not seen. He states he is still having swelling to his lip area.

## 2019-11-14 ENCOUNTER — Other Ambulatory Visit: Payer: Self-pay

## 2019-11-14 DIAGNOSIS — Z79899 Other long term (current) drug therapy: Secondary | ICD-10-CM

## 2019-11-14 DIAGNOSIS — I4510 Unspecified right bundle-branch block: Secondary | ICD-10-CM | POA: Diagnosis present

## 2019-11-14 DIAGNOSIS — I119 Hypertensive heart disease without heart failure: Secondary | ICD-10-CM | POA: Diagnosis present

## 2019-11-14 DIAGNOSIS — Z6841 Body Mass Index (BMI) 40.0 and over, adult: Secondary | ICD-10-CM

## 2019-11-14 DIAGNOSIS — Z794 Long term (current) use of insulin: Secondary | ICD-10-CM

## 2019-11-14 DIAGNOSIS — Z833 Family history of diabetes mellitus: Secondary | ICD-10-CM

## 2019-11-14 DIAGNOSIS — G4733 Obstructive sleep apnea (adult) (pediatric): Secondary | ICD-10-CM | POA: Diagnosis present

## 2019-11-14 DIAGNOSIS — I160 Hypertensive urgency: Secondary | ICD-10-CM | POA: Diagnosis present

## 2019-11-14 DIAGNOSIS — A419 Sepsis, unspecified organism: Principal | ICD-10-CM | POA: Diagnosis present

## 2019-11-14 DIAGNOSIS — F17210 Nicotine dependence, cigarettes, uncomplicated: Secondary | ICD-10-CM | POA: Diagnosis present

## 2019-11-14 DIAGNOSIS — E1165 Type 2 diabetes mellitus with hyperglycemia: Secondary | ICD-10-CM | POA: Diagnosis present

## 2019-11-14 DIAGNOSIS — Z20822 Contact with and (suspected) exposure to covid-19: Secondary | ICD-10-CM | POA: Diagnosis present

## 2019-11-14 DIAGNOSIS — Z7984 Long term (current) use of oral hypoglycemic drugs: Secondary | ICD-10-CM

## 2019-11-14 DIAGNOSIS — L03314 Cellulitis of groin: Secondary | ICD-10-CM | POA: Diagnosis present

## 2019-11-14 LAB — LACTIC ACID, PLASMA: Lactic Acid, Venous: 1.7 mmol/L (ref 0.5–1.9)

## 2019-11-14 LAB — COMPREHENSIVE METABOLIC PANEL
ALT: 20 U/L (ref 0–44)
AST: 16 U/L (ref 15–41)
Albumin: 4.3 g/dL (ref 3.5–5.0)
Alkaline Phosphatase: 86 U/L (ref 38–126)
Anion gap: 11 (ref 5–15)
BUN: 14 mg/dL (ref 6–20)
CO2: 25 mmol/L (ref 22–32)
Calcium: 9.2 mg/dL (ref 8.9–10.3)
Chloride: 99 mmol/L (ref 98–111)
Creatinine, Ser: 0.94 mg/dL (ref 0.61–1.24)
GFR, Estimated: >60 mL/min (ref >60)
Glucose, Bld: 221 mg/dL — ABNORMAL HIGH (ref 70–99)
Potassium: 4.2 mmol/L (ref 3.5–5.1)
Sodium: 135 mmol/L (ref 135–145)
Total Bilirubin: 1 mg/dL (ref 0.3–1.2)
Total Protein: 7.8 g/dL (ref 6.5–8.1)

## 2019-11-14 LAB — CBC
HCT: 48.3 % (ref 39.0–52.0)
Hemoglobin: 15.8 g/dL (ref 13.0–17.0)
MCH: 26.9 pg (ref 26.0–34.0)
MCHC: 32.7 g/dL (ref 30.0–36.0)
MCV: 82.3 fL (ref 80.0–100.0)
Platelets: 183 10*3/uL (ref 150–400)
RBC: 5.87 MIL/uL — ABNORMAL HIGH (ref 4.22–5.81)
RDW: 14.4 % (ref 11.5–15.5)
WBC: 14.2 10*3/uL — ABNORMAL HIGH (ref 4.0–10.5)
nRBC: 0 % (ref 0.0–0.2)

## 2019-11-14 NOTE — ED Notes (Signed)
1 set of BC drawn and sent to lab.

## 2019-11-14 NOTE — ED Triage Notes (Signed)
Pt in with co large abscess  To groin area x days. States hx of the same but never on groin area. States has had chills today.

## 2019-11-15 ENCOUNTER — Inpatient Hospital Stay
Admission: EM | Admit: 2019-11-15 | Discharge: 2019-11-16 | DRG: 872 | Disposition: A | Discharge status: Home / Self Care | Payer: Medicaid Other | Attending: Family Medicine | Admitting: Family Medicine

## 2019-11-15 ENCOUNTER — Encounter: Payer: Self-pay | Admitting: Radiology

## 2019-11-15 ENCOUNTER — Emergency Department: Payer: Medicaid Other

## 2019-11-15 ENCOUNTER — Other Ambulatory Visit: Payer: Self-pay

## 2019-11-15 DIAGNOSIS — L03314 Cellulitis of groin: Principal | ICD-10-CM

## 2019-11-15 DIAGNOSIS — Z794 Long term (current) use of insulin: Secondary | ICD-10-CM | POA: Diagnosis not present

## 2019-11-15 DIAGNOSIS — A419 Sepsis, unspecified organism: Secondary | ICD-10-CM | POA: Diagnosis present

## 2019-11-15 DIAGNOSIS — Z833 Family history of diabetes mellitus: Secondary | ICD-10-CM | POA: Diagnosis not present

## 2019-11-15 DIAGNOSIS — I119 Hypertensive heart disease without heart failure: Secondary | ICD-10-CM | POA: Diagnosis present

## 2019-11-15 DIAGNOSIS — I4510 Unspecified right bundle-branch block: Secondary | ICD-10-CM | POA: Diagnosis present

## 2019-11-15 DIAGNOSIS — E1165 Type 2 diabetes mellitus with hyperglycemia: Secondary | ICD-10-CM | POA: Diagnosis present

## 2019-11-15 DIAGNOSIS — Z20822 Contact with and (suspected) exposure to covid-19: Secondary | ICD-10-CM | POA: Diagnosis present

## 2019-11-15 DIAGNOSIS — I160 Hypertensive urgency: Secondary | ICD-10-CM

## 2019-11-15 DIAGNOSIS — L0390 Cellulitis, unspecified: Secondary | ICD-10-CM | POA: Diagnosis not present

## 2019-11-15 DIAGNOSIS — Z7984 Long term (current) use of oral hypoglycemic drugs: Secondary | ICD-10-CM | POA: Diagnosis not present

## 2019-11-15 DIAGNOSIS — F17210 Nicotine dependence, cigarettes, uncomplicated: Secondary | ICD-10-CM | POA: Diagnosis present

## 2019-11-15 DIAGNOSIS — Z79899 Other long term (current) drug therapy: Secondary | ICD-10-CM | POA: Diagnosis not present

## 2019-11-15 DIAGNOSIS — Z6841 Body Mass Index (BMI) 40.0 and over, adult: Secondary | ICD-10-CM | POA: Diagnosis not present

## 2019-11-15 DIAGNOSIS — G4733 Obstructive sleep apnea (adult) (pediatric): Secondary | ICD-10-CM | POA: Diagnosis present

## 2019-11-15 LAB — GLUCOSE, CAPILLARY
Glucose-Capillary: 286 mg/dL — ABNORMAL HIGH (ref 70–99)
Glucose-Capillary: 370 mg/dL — ABNORMAL HIGH (ref 70–99)
Glucose-Capillary: 323 mg/dL — ABNORMAL HIGH (ref 70–99)
Glucose-Capillary: 290 mg/dL — ABNORMAL HIGH (ref 70–99)

## 2019-11-15 LAB — RESPIRATORY PANEL BY RT PCR (FLU A&B, COVID)
Influenza A by PCR: NEGATIVE
Influenza B by PCR: NEGATIVE
SARS Coronavirus 2 by RT PCR: NEGATIVE

## 2019-11-15 LAB — APTT: aPTT: 28 s (ref 24–36)

## 2019-11-15 LAB — HIV ANTIBODY (ROUTINE TESTING W REFLEX): HIV Screen 4th Generation wRfx: NONREACTIVE

## 2019-11-15 LAB — PROTIME-INR
INR: 1.1 (ref 0.8–1.2)
Prothrombin Time: 13.3 s (ref 11.4–15.2)

## 2019-11-15 MED ORDER — LACTATED RINGERS IV SOLN: INTRAVENOUS | Status: AC

## 2019-11-15 MED ORDER — LORATADINE 10 MG PO TABS
10.0000 mg | ORAL_TABLET | Freq: Every day | ORAL | Status: DC
  Administered 2019-11-16: 10 mg via ORAL
  Filled 2019-11-15 (×2): qty 1

## 2019-11-15 MED ORDER — MORPHINE SULFATE (PF) 4 MG/ML IV SOLN
4.0000 mg | Freq: Once | INTRAVENOUS | Status: AC
  Administered 2019-11-15: 4 mg via INTRAVENOUS
  Filled 2019-11-15: qty 1

## 2019-11-15 MED ORDER — METRONIDAZOLE IN NACL 5-0.79 MG/ML-% IV SOLN
500.0000 mg | Freq: Three times a day (TID) | INTRAVENOUS | Status: DC
  Administered 2019-11-15 – 2019-11-16 (×3): 500 mg via INTRAVENOUS
  Filled 2019-11-15 (×5): qty 100

## 2019-11-15 MED ORDER — VANCOMYCIN HCL IN DEXTROSE 1-5 GM/200ML-% IV SOLN: 1000.0000 mg | Freq: Once | INTRAVENOUS | Status: DC

## 2019-11-15 MED ORDER — ONDANSETRON HCL 4 MG PO TABS: 4.0000 mg | ORAL_TABLET | Freq: Four times a day (QID) | ORAL | Status: DC | PRN

## 2019-11-15 MED ORDER — LISINOPRIL 5 MG PO TABS: 5.0000 mg | ORAL_TABLET | Freq: Every day | ORAL | Status: DC

## 2019-11-15 MED ORDER — VANCOMYCIN HCL IN DEXTROSE 1-5 GM/200ML-% IV SOLN
1000.0000 mg | Freq: Once | INTRAVENOUS | Status: AC
  Administered 2019-11-15: 1000 mg via INTRAVENOUS
  Filled 2019-11-15: qty 200

## 2019-11-15 MED ORDER — ACETAMINOPHEN 325 MG PO TABS: 650.0000 mg | ORAL_TABLET | Freq: Four times a day (QID) | ORAL | Status: DC | PRN

## 2019-11-15 MED ORDER — ONDANSETRON HCL 4 MG/2ML IJ SOLN
4.0000 mg | Freq: Once | INTRAMUSCULAR | Status: AC
  Administered 2019-11-15: 4 mg via INTRAVENOUS
  Filled 2019-11-15: qty 2

## 2019-11-15 MED ORDER — INSULIN GLARGINE 100 UNIT/ML ~~LOC~~ SOLN
10.0000 [IU] | Freq: Every day | SUBCUTANEOUS | Status: DC
  Administered 2019-11-15: 10 [IU] via SUBCUTANEOUS
  Filled 2019-11-15 (×2): qty 0.1

## 2019-11-15 MED ORDER — INSULIN GLARGINE 100 UNIT/ML ~~LOC~~ SOLN
15.0000 [IU] | Freq: Every day | SUBCUTANEOUS | Status: DC
  Filled 2019-11-15 (×2): qty 0.15

## 2019-11-15 MED ORDER — TRAZODONE HCL 50 MG PO TABS: 25.0000 mg | ORAL_TABLET | Freq: Every evening | ORAL | Status: DC | PRN

## 2019-11-15 MED ORDER — IOHEXOL 300 MG/ML  SOLN
125.0000 mL | Freq: Once | INTRAMUSCULAR | Status: AC | PRN
  Administered 2019-11-15: 125 mL via INTRAVENOUS

## 2019-11-15 MED ORDER — LACTATED RINGERS IV BOLUS (SEPSIS)
600.0000 mL | Freq: Once | INTRAVENOUS | Status: AC
  Administered 2019-11-15: 600 mL via INTRAVENOUS

## 2019-11-15 MED ORDER — LACTATED RINGERS IV BOLUS (SEPSIS)
1000.0000 mL | Freq: Once | INTRAVENOUS | Status: AC
  Administered 2019-11-15: 1000 mL via INTRAVENOUS

## 2019-11-15 MED ORDER — SODIUM CHLORIDE 0.9 % IV SOLN
2.0000 g | Freq: Once | INTRAVENOUS | Status: AC
  Administered 2019-11-15: 2 g via INTRAVENOUS
  Filled 2019-11-15: qty 2

## 2019-11-15 MED ORDER — FAMOTIDINE 20 MG PO TABS
20.0000 mg | ORAL_TABLET | Freq: Two times a day (BID) | ORAL | Status: DC
  Administered 2019-11-15 – 2019-11-16 (×3): 20 mg via ORAL
  Filled 2019-11-15 (×3): qty 1

## 2019-11-15 MED ORDER — ALPRAZOLAM 0.25 MG PO TABS
0.2500 mg | ORAL_TABLET | Freq: Three times a day (TID) | ORAL | Status: DC
  Administered 2019-11-15 – 2019-11-16 (×2): 0.25 mg via ORAL
  Filled 2019-11-15 (×2): qty 1

## 2019-11-15 MED ORDER — KETOROLAC TROMETHAMINE 30 MG/ML IJ SOLN
15.0000 mg | Freq: Four times a day (QID) | INTRAMUSCULAR | Status: DC | PRN
  Administered 2019-11-15 – 2019-11-16 (×2): 15 mg via INTRAVENOUS
  Filled 2019-11-15 (×2): qty 1

## 2019-11-15 MED ORDER — HYDROMORPHONE HCL 1 MG/ML IJ SOLN
0.5000 mg | Freq: Once | INTRAMUSCULAR | Status: AC
  Administered 2019-11-15: 0.5 mg via INTRAVENOUS
  Filled 2019-11-15: qty 1

## 2019-11-15 MED ORDER — SODIUM CHLORIDE 0.9 % IV SOLN: 2.0000 g | Freq: Once | INTRAVENOUS | Status: DC

## 2019-11-15 MED ORDER — ACETAMINOPHEN 650 MG RE SUPP: 650.0000 mg | Freq: Four times a day (QID) | RECTAL | Status: DC | PRN

## 2019-11-15 MED ORDER — ENOXAPARIN SODIUM 40 MG/0.4ML ~~LOC~~ SOLN
40.0000 mg | Freq: Two times a day (BID) | SUBCUTANEOUS | Status: DC
  Administered 2019-11-15 – 2019-11-16 (×2): 40 mg via SUBCUTANEOUS
  Filled 2019-11-15 (×2): qty 0.4

## 2019-11-15 MED ORDER — EPINEPHRINE 0.3 MG/0.3ML IJ SOAJ
0.3000 mg | INTRAMUSCULAR | Status: DC | PRN
  Filled 2019-11-15: qty 0.3

## 2019-11-15 MED ORDER — SODIUM CHLORIDE 0.9 % IV SOLN
2.0000 g | Freq: Three times a day (TID) | INTRAVENOUS | Status: DC
  Administered 2019-11-15 – 2019-11-16 (×3): 2 g via INTRAVENOUS
  Filled 2019-11-15 (×5): qty 2

## 2019-11-15 MED ORDER — LABETALOL HCL 5 MG/ML IV SOLN: 20.0000 mg | INTRAVENOUS | Status: DC | PRN

## 2019-11-15 MED ORDER — SODIUM CHLORIDE 0.9 % IV SOLN: INTRAVENOUS | Status: DC

## 2019-11-15 MED ORDER — MAGNESIUM HYDROXIDE 400 MG/5ML PO SUSP: 30.0000 mL | Freq: Every day | ORAL | Status: DC | PRN

## 2019-11-15 MED ORDER — VANCOMYCIN HCL 1500 MG/300ML IV SOLN
1500.0000 mg | Freq: Three times a day (TID) | INTRAVENOUS | Status: DC
  Administered 2019-11-15 – 2019-11-16 (×4): 1500 mg via INTRAVENOUS
  Filled 2019-11-15 (×6): qty 300

## 2019-11-15 MED ORDER — METRONIDAZOLE IN NACL 5-0.79 MG/ML-% IV SOLN
500.0000 mg | Freq: Once | INTRAVENOUS | Status: AC
  Administered 2019-11-15: 500 mg via INTRAVENOUS
  Filled 2019-11-15: qty 100

## 2019-11-15 MED ORDER — TRAZODONE HCL 50 MG PO TABS
50.0000 mg | ORAL_TABLET | Freq: Every day | ORAL | Status: DC
  Administered 2019-11-15: 50 mg via ORAL
  Filled 2019-11-15 (×2): qty 1

## 2019-11-15 MED ORDER — ONDANSETRON HCL 4 MG/2ML IJ SOLN: 4.0000 mg | Freq: Four times a day (QID) | INTRAMUSCULAR | Status: DC | PRN

## 2019-11-15 MED ORDER — INSULIN ASPART 100 UNIT/ML ~~LOC~~ SOLN
0.0000 [IU] | Freq: Three times a day (TID) | SUBCUTANEOUS | Status: DC
  Administered 2019-11-15: 15 [IU] via SUBCUTANEOUS
  Administered 2019-11-15 (×2): 8 [IU] via SUBCUTANEOUS
  Administered 2019-11-15 – 2019-11-16 (×3): 11 [IU] via SUBCUTANEOUS
  Filled 2019-11-15 (×6): qty 1

## 2019-11-15 MED ORDER — LISINOPRIL 20 MG PO TABS
20.0000 mg | ORAL_TABLET | Freq: Every day | ORAL | Status: DC
  Administered 2019-11-15 – 2019-11-16 (×2): 20 mg via ORAL
  Filled 2019-11-15: qty 2
  Filled 2019-11-15: qty 1

## 2019-11-15 MED ORDER — EMPAGLIFLOZIN 10 MG PO TABS
10.0000 mg | ORAL_TABLET | Freq: Every day | ORAL | Status: DC
  Filled 2019-11-15 (×2): qty 1

## 2019-11-15 NOTE — Progress Notes (Signed)
PHARMACY -  BRIEF ANTIBIOTIC NOTE   Pharmacy has received consult(s) for Cefepime and Vancomycin from an ED provider.  The patient's profile has been reviewed for ht/wt/allergies/indication/available labs.    One time order(s) placed for Vancomycin 1gm and Cefepime 2gm  Further antibiotics/pharmacy consults should be ordered by admitting physician if indicated.                       Thank you, Valrie Hart A 11/15/2019  3:14 AM

## 2019-11-15 NOTE — ED Provider Notes (Signed)
[facility] Emergency Department Provider Note  ____________________________________________  Time seen: Approximately 3:30 AM  I have reviewed the triage vital signs and the nursing notes.   HISTORY  Chief Complaint Abscess   HPI Tony West is a 39 y.o. male the history of type 2 diabetes, hypertension, OSA, obesity who presents for evaluation of groin pain.  Patient reports 2 to 3 days of progressively worsening swelling, pain, and redness of the left groin area.  Fever and chills started today.  Pain is 10 out of 10, sharp, constant and nonradiating.   Has had some nausea but no vomiting, no abdominal pain, no testicular pain.  Past Medical History:  Diagnosis Date   Diabetes mellitus without complication (HCC)    Hypertension    Sleep apnea    CPAP prescribed but no longer using    Past Surgical History:  Procedure Laterality Date   HERNIA REPAIR     ventral with mesh   INSERTION OF MESH     VENTRAL HERNIA REPAIR N/A 05/06/2017   Procedure: HERNIA REPAIR VENTRAL ADULT;  Surgeon: Carolan Shiver, MD;  Location: [facility] ORS;  Service: General;  Laterality: N/A;    Prior to Admission medications   Medication Sig Start Date End Date Taking? Authorizing Provider  ciprofloxacin (CILOXAN) 0.3 % ophthalmic solution Administer 2 drop, every 6 hours to the LEFT EAR for seven days 06/11/17   Phineas Semen, MD  Empagliflozin (JARDIANCE PO) Take by mouth.    [provider]  EPINEPHrine 0.3 mg/0.3 mL IJ SOAJ injection Inject 0.3 mLs (0.3 mg total) into the muscle as needed for anaphylaxis. 10/10/19   Brimage, Seward Meth, DO  famotidine (PEPCID) 20 MG tablet Take 1 tablet (20 mg total) by mouth 2 (two) times daily. 10/10/19   Katha Cabal, DO  Insulin Glargine (LANTUS SOLOSTAR) 100 UNIT/ML Solostar Pen Inject 10 Units into the skin daily.    [provider]  lisinopril (PRINIVIL,ZESTRIL) 5 MG tablet Take 5 mg by mouth daily.     [provider]  loratadine (CLARITIN) 10 MG tablet Take 1 tablet (10 mg total) by mouth daily. 10/10/19 11/09/19  Katha Cabal, DO  metFORMIN (GLUCOPHAGE) 500 MG tablet Take 500 mg by mouth daily.    [provider]    Allergies Shellfish allergy  Family History  Problem Relation Age of Onset   Diabetes Mother    Diabetes Father     Social History Social History   Tobacco Use   Smoking status: Current Some Day Smoker    Packs/day: 0.50   Smokeless tobacco: Never Used  Vaping Use   Vaping Use: Some days  Substance Use Topics   Alcohol use: No   Drug use: No    Review of Systems  Constitutional: + fever and chills Eyes: Negative for visual changes. ENT: Negative for sore throat. Neck: No neck pain  Cardiovascular: Negative for chest pain. Respiratory: Negative for shortness of breath. Gastrointestinal: Negative for abdominal pain, vomiting or diarrhea. Genitourinary: Negative for dysuria. + groin infection Musculoskeletal: Negative for back pain. Skin: Negative for rash. Neurological: Negative for headaches, weakness or numbness. Psych: No SI or HI  ____________________________________________   PHYSICAL EXAM:  VITAL SIGNS: ED Triage Vitals  Enc Vitals Group     BP 11/14/19 2258 (!) 177/91     Pulse Rate 11/14/19 2256 (!) 119     Resp 11/14/19 2256 20     Temp 11/14/19 2256 100.2 F (37.9 C)  Temp Source 11/14/19 2256 Oral     SpO2 11/14/19 2256 97 %     Weight 11/14/19 2256 (!) 367 lb (166.5 kg)     Height 11/14/19 2256 6\' 3"  (1.905 m)     Head Circumference --      Peak Flow --      Pain Score 11/14/19 2256 8     Pain Loc --      Pain Edu? --      Excl. in GC? --     Constitutional: Alert and oriented. Well appearing and in no apparent distress. HEENT:      Head: Normocephalic and atraumatic.         Eyes: Conjunctivae are normal. Sclera is non-icteric.       Mouth/Throat: Mucous membranes are moist.       Neck:  Supple with no signs of meningismus. Cardiovascular: Tachycardic with regular rhythm. Respiratory: Normal respiratory effort. Lungs are clear to auscultation bilaterally. No wheezes, crackles, or rhonchi.  Gastrointestinal: Soft, non tender, and non distended with positive bowel sounds. No rebound or guarding. Genitourinary: Induration, erythema, and warmth located in the left groin area with no obvious fluctuance, no crepitus, no pus, no bullae. Musculoskeletal:  No edema, cyanosis, or erythema of extremities. Neurologic: Normal speech and language. Face is symmetric. Moving all extremities. No gross focal neurologic deficits are appreciated. Skin: Skin is warm, dry and intact. No rash noted. Psychiatric: Mood and affect are normal. Speech and behavior are normal.  ____________________________________________   LABS (all labs ordered are listed, but only abnormal results are displayed)  Labs Reviewed  CBC - Abnormal; Notable for the following components:      Result Value   WBC 14.2 (*)    RBC 5.87 (*)    All other components within normal limits  COMPREHENSIVE METABOLIC PANEL - Abnormal; Notable for the following components:   Glucose, Bld 221 (*)    All other components within normal limits  CULTURE, BLOOD (ROUTINE X 2)  CULTURE, BLOOD (ROUTINE X 2)  URINE CULTURE  RESPIRATORY PANEL BY RT PCR (FLU A&B, COVID)  LACTIC ACID, PLASMA  LACTIC ACID, PLASMA  URINALYSIS, COMPLETE (UACMP) WITH MICROSCOPIC  PROTIME-INR  APTT   ____________________________________________  EKG  ED ECG REPORT I, 01/14/20, the attending physician, personally viewed and interpreted this ECG.  Sinus tachycardia, rate of 105, incomplete right bundle branch block, normal QTC, no ST elevations or depressions ____________________________________________  RADIOLOGY  I have personally reviewed the images performed during this visit and I agree with the Radiologist's read.   Interpretation by  Radiologist:  CT PELVIS W CONTRAST  Result Date: 11/15/2019 CLINICAL DATA:  Perineal abscess EXAM: CT PELVIS WITH CONTRAST TECHNIQUE: Multidetector CT imaging of the pelvis was performed using the standard protocol following the bolus administration of intravenous contrast. CONTRAST:  11/17/2019 OMNIPAQUE IOHEXOL 300 MG/ML  SOLN COMPARISON:  None. FINDINGS: Urinary Tract:  No abnormality visualized. Bowel:  Unremarkable visualized pelvic bowel loops. Vascular/Lymphatic: No pathologically enlarged lymph nodes. No significant vascular abnormality seen. Reproductive:  No mass or other significant abnormality Other: There is subcutaneous induration within the left anterior pubic soft tissues. No abscess or fluid collection. There are bilateral inguinal lymph nodes that are likely reactive. Musculoskeletal: No suspicious bone lesions identified. IMPRESSION: Subcutaneous induration within the left anterior pubic soft tissues, consistent with cellulitis. No abscess or fluid collection. Electronically Signed   By: M.D.   On: 11/15/2019 02:12     ____________________________________________  PROCEDURES  Procedure(s) performed:yes .1-3 Lead EKG Interpretation Performed by: Nita Sickle, MD Authorized by: Nita Sickle, MD     Interpretation: non-specific     ECG rate assessment: tachycardic     Rhythm: sinus tachycardia     Ectopy: none     Critical Care performed: yes  CRITICAL CARE Performed by: Nita Sickle  ?  Total critical care time: 40 min  Critical care time was exclusive of separately billable procedures and treating other patients.  Critical care was necessary to treat or prevent imminent or life-threatening deterioration.  Critical care was time spent personally by me on the following activities: development of treatment plan with patient and/or surrogate as well as nursing, discussions with consultants, evaluation of patient's response to treatment,  examination of patient, obtaining history from patient or surrogate, ordering and performing treatments and interventions, ordering and review of laboratory studies, ordering and review of radiographic studies, pulse oximetry and re-evaluation of patient's condition.  ____________________________________________   INITIAL IMPRESSION / ASSESSMENT AND PLAN / ED COURSE   39 y.o. male the history of type 2 diabetes, hypertension, OSA, obesity who presents for evaluation of groin pain.  Patient with induration, erythema, and warmth of the left groin area with no obvious palpable abscess, no crepitus or bulla.  He is febrile and tachycardic, white count is elevated 14.2.  Lactic's normal.  Mildly elevated blood glucose with no signs of DKA.  CT visualized by me showing no signs of abscess or necrotizing infection, does show cellulitis, confirmed by radiology.  Patient meets sepsis criteria.  Was started on sepsis protocol.  Will admit to the hospitalist service.  Old medical records reviewed.  Patient placed on telemetry for close monitoring.      _____________________________________________ Please note:  Patient was evaluated in Emergency Department today for the symptoms described in the history of present illness. Patient was evaluated in the context of the global COVID-19 pandemic, which necessitated consideration that the patient might be at risk for infection with the SARS-CoV-2 virus that causes COVID-19. Institutional protocols and algorithms that pertain to the evaluation of patients at risk for COVID-19 are in a state of rapid change based on information released by regulatory bodies including the CDC and federal and state organizations. These policies and algorithms were followed during the patient's care in the ED.  Some ED evaluations and interventions may be delayed as a result of limited staffing during the pandemic.   [place] Controlled Substance Database was reviewed by  me. ____________________________________________   FINAL CLINICAL IMPRESSION(S) / ED DIAGNOSES   Final diagnoses:  Cellulitis of groin, left  Sepsis without acute organ dysfunction, due to unspecified organism [facility])      NEW MEDICATIONS STARTED DURING THIS VISIT:  ED Discharge Orders    None       Note:  This document was prepared using Dragon voice recognition software and may include unintentional dictation errors.    Don Perking, [place], MD 11/15/19 Jeralyn Bennett

## 2019-11-15 NOTE — Progress Notes (Signed)
Anticoagulation monitoring(Lovenox):  39yo  M ordered Lovenox 40 mg Q24h  Filed Weights   11/14/19 2256  Weight: (!) 166.5 kg (367 lb)   BMI 45.8   Lab Results  Component Value Date   CREATININE 0.94 11/14/2019   CREATININE 0.84 09/26/2016   CREATININE 0.93 05/30/2016   Estimated Creatinine Clearance: 171.6 mL/min (by C-G formula based on SCr of 0.94 mg/dL). Hemoglobin & Hematocrit     Component Value Date/Time   HGB 15.8 11/14/2019 2259   HGB 15.0 08/13/2013 1550   HCT 48.3 11/14/2019 2259   HCT 47.3 08/13/2013 1550     Per Protocol for Patient with estCrcl > 30 ml/min and BMI > 40, will transition to Lovenox 40 mg Q12h.     Bari Mantis PharmD Clinical Pharmacist 11/15/2019

## 2019-11-15 NOTE — ED Notes (Signed)
Pt given meal tray and drink at this time 

## 2019-11-15 NOTE — Progress Notes (Signed)
PROGRESS NOTE    Patient: Tony West                            PCP: [facility], [facility]                    DOB: February 11, 1980            DOA: 11/15/2019 JZP:[id number]             DOS: 11/15/2019, 7:14 AM   LOS: 0 days   Date of Service: The patient was seen and examined on 11/15/2019  Subjective:   The patient was seen and examined this Am. Hemodynamically stable Still complaining of tenderness in the left groin area Otherwise no issues overnight .  Brief Narrative:   Tony West  is a 39 y.o. African-American male with a known history of type II diabetes mellitus, hypertension and sleep apnea no longer using CPAP, who presented to the emergency room with acute onset of left groin pain with associated swelling and tenderness with induration over the last couple of days.  He was noted to have fever without chills or abdominal pain.     He has not been vaccinated for COVID-19.  He denies any COVID-19 recent exposure.  ER  Temp.was 100.2 and heart rate 119, BP 177/91.  Labs revealed hyperglycemia of 221 with otherwise unremarkable CMP. Lactic acid was 1.7 and CBC showed leukocytosis of 14.2.   Respiratory panel negative influ. A/Bm COVID were negative. Blood cultures were sent. EKG showed sinus tachycardia with a rate of 105 with probable left atrial enlargement and incomplete right bundle branch block.   Pelvic CT scan with contrast revealed subcutaneous induration within the left inferior pubic soft tissues consistent with cellulitis with no abscess or fluid collection.  The patient was given empiric antibiotic therapy with IV cefepime, vancomycin and Flagyl as well as a bolus of 2.6 L of IV lactated Ringer, 4 mg of IV morphine sulfate and 4 mg of IV Zofran.    Assessment & Plan:   Active Problems:   Sepsis due to cellulitis (HCC)  Left groin severe nonpurulent cellulitis with subsequent sepsis without severe sepsis.   Sepsis is manifested by leukocytosis  and tachycardia. -Improved sepsis physiology: T-max 100.2, PR 93 RR 16 BP 155/78, LA 1.7, WBC 14.2 -Tenderness left groin area, erythema edema -We will continue IV cefepime, vancomycin and Flagyl. -Warm compresses will be utilized. -We will follow lactic acid level.   Uncontrolled type 2 diabetes mellitus. -Checking blood sugar QA CHS, with SSI coverage -Resuming home medication -This is likely secondary to #1. -continue Jardiance and basal coverage.  Hypertensive urgency. -Continues antihypertensives and place him on as needed IV labetalol. -Home medication of lisinopril increased from 5 to 20 mg daily,      Cultures; Blood Cultures x 2 >>    Antimicrobials: 11/15/2019 IV cefepime, vancomycin and Flagyl.    Consultants: None   --------------------------------------------------------------------------------------------------------------------------------------------- DVT prophylaxis:  SCD/Compression stockings and Lovenox SQ Code Status:   Code Status: Full Code Family Communication: No family member present at bedside- attempt will be made to update daily The above findings and plan of care has been discussed with patient (and family )  in detail,  they expressed understanding and agreement of above. -Advance care planning has been discussed.   Admission status:    Status is: Inpatient  Remains inpatient appropriate because:Inpatient level of care appropriate due to severity  of illness   Dispo: The patient is from: Home              Anticipated d/c is to: Home              Anticipated d/c date is: 2 days              Patient currently is not medically stable to d/c.        Procedures:   No admission procedures for hospital encounter.     Antimicrobials:  Anti-infectives (From admission, onward)   Start     Dose/Rate Route Frequency Ordered Stop   11/15/19 1230  vancomycin (VANCOREADY) IVPB 1500 mg/300 mL        1,500 mg 150 mL/hr over 120  Minutes Intravenous Every 8 hours 11/15/19 0549     11/15/19 1200  metroNIDAZOLE (FLAGYL) IVPB 500 mg        500 mg 100 mL/hr over 60 Minutes Intravenous Every 8 hours 11/15/19 0532     11/15/19 1200  ceFEPIme (MAXIPIME) 2 g in sodium chloride 0.9 % 100 mL IVPB        2 g 200 mL/hr over 30 Minutes Intravenous Every 8 hours 11/15/19 0549     11/15/19 0545  ceFEPIme (MAXIPIME) 2 g in sodium chloride 0.9 % 100 mL IVPB  Status:  Discontinued        2 g 200 mL/hr over 30 Minutes Intravenous  Once 11/15/19 0532 11/15/19 0540   11/15/19 0545  vancomycin (VANCOCIN) IVPB 1000 mg/200 mL premix  Status:  Discontinued        1,000 mg 200 mL/hr over 60 Minutes Intravenous  Once 11/15/19 0532 11/15/19 0542   11/15/19 0545  vancomycin (VANCOCIN) IVPB 1000 mg/200 mL premix  Status:  Discontinued        1,000 mg 200 mL/hr over 60 Minutes Intravenous  Once 11/15/19 0532 11/15/19 0541   11/15/19 0545  ceFEPIme (MAXIPIME) 2 g in sodium chloride 0.9 % 100 mL IVPB  Status:  Discontinued        2 g 200 mL/hr over 30 Minutes Intravenous  Once 11/15/19 0532 11/15/19 0543   11/15/19 0315  ceFEPIme (MAXIPIME) 2 g in sodium chloride 0.9 % 100 mL IVPB        2 g 200 mL/hr over 30 Minutes Intravenous  Once 11/15/19 0311 11/15/19 0411   11/15/19 0315  metroNIDAZOLE (FLAGYL) IVPB 500 mg        500 mg 100 mL/hr over 60 Minutes Intravenous  Once 11/15/19 0311 11/15/19 0509   11/15/19 0315  vancomycin (VANCOCIN) IVPB 1000 mg/200 mL premix        1,000 mg 200 mL/hr over 60 Minutes Intravenous  Once 11/15/19 0311 11/15/19 3557       Medication:   empagliflozin  10 mg Oral Daily   enoxaparin (LOVENOX) injection  40 mg Subcutaneous Q24H   famotidine  20 mg Oral BID   insulin aspart  0-15 Units Subcutaneous TID PC & HS   insulin glargine  10 Units Subcutaneous Daily   lisinopril  5 mg Oral Daily   loratadine  10 mg Oral Daily    acetaminophen **OR** acetaminophen, EPINEPHrine, ketorolac, labetalol,  magnesium hydroxide, ondansetron **OR** ondansetron (ZOFRAN) IV, traZODone   Objective:   Vitals:   11/14/19 2258 11/15/19 0407 11/15/19 0551 11/15/19 0649  BP: (!) 177/91 (!) 164/91 (!) 125/98 (!) 168/89  Pulse:  (!) 101 (!) 111 (!) 103  Resp:  20 20 20  Temp:      TempSrc:      SpO2:  96% 93% 94%  Weight:      Height:        Intake/Output Summary (Last 24 hours) at 11/15/2019 6144 Last data filed at 11/15/2019 3154 Gross per 24 hour  Intake 3031.67 ml  Output --  Net 3031.67 ml   Filed Weights   11/14/19 2256  Weight: (!) 166.5 kg     Examination:   Physical Exam  Constitution:  Alert, cooperative, no distress,  Appears calm and comfortable  Psychiatric: Normal and stable mood and affect, cognition intact,   HEENT: Normocephalic, PERRL, otherwise with in Normal limits  Chest:Chest symmetric Cardio vascular:  S1/S2, RRR, No murmure, No Rubs or Gallops  pulmonary: Clear to auscultation bilaterally, respirations unlabored, negative wheezes / crackles Abdomen: Soft, non-tender, non-distended, bowel sounds,no masses, no organomegaly Muscular skeletal: Limited exam - in bed, able to move all 4 extremities, Normal strength,  Neuro: CNII-XII intact. , normal motor and sensation, reflexes intact  Extremities: No pitting edema lower extremities, +2 pulses  Skin: Dry, warm to touch, negative for any Rashes, No open wounds -  Left groin erythema edema tenderness Wounds: per nursing documentation    ------------------------------------------------------------------------------------------------------------------------------------------    LABs:  CBC Latest Ref Rng & Units 11/14/2019 09/26/2016 05/30/2016  WBC 4.0 - 10.5 K/uL 14.2(H) 8.3 9.1  Hemoglobin 13.0 - 17.0 g/dL 00.8 67.6 19.5  Hematocrit 39 - 52 % 48.3 47.8 50.6  Platelets 150 - 400 K/uL 183 165 159   CMP Latest Ref Rng & Units 11/14/2019 04/06/2017 09/26/2016  Glucose 70 - 99 mg/dL 093(O) 671(I) 458(K)  BUN 6 - 20  mg/dL 14 - 10  Creatinine 9.98 - 1.24 mg/dL 3.38 - 2.50  Sodium 539 - 145 mmol/L 135 - 138  Potassium 3.5 - 5.1 mmol/L 4.2 - 4.2  Chloride 98 - 111 mmol/L 99 - 104  CO2 22 - 32 mmol/L 25 - 26  Calcium 8.9 - 10.3 mg/dL 9.2 - 9.0  Total Protein 6.5 - 8.1 g/dL 7.8 - -  Total Bilirubin 0.3 - 1.2 mg/dL 1.0 - -  Alkaline Phos 38 - 126 U/L 86 - -  AST 15 - 41 U/L 16 - -  ALT 0 - 44 U/L 20 - -       Micro Results Recent Results (from the past 240 hour(s))  Respiratory Panel by RT PCR (Flu A&B, Covid) - Nasopharyngeal Swab     Status: None   Collection Time: 11/15/19  3:42 AM   Specimen: Nasopharyngeal Swab  Result Value Ref Range Status   SARS Coronavirus 2 by RT PCR NEGATIVE NEGATIVE Final    Comment: (NOTE) SARS-CoV-2 target nucleic acids are NOT DETECTED.  The SARS-CoV-2 RNA is generally detectable in upper respiratoy specimens during the acute phase of infection. The lowest concentration of SARS-CoV-2 viral copies this assay can detect is 131 copies/mL. A negative result does not preclude SARS-Cov-2 infection and should not be used as the sole basis for treatment or other patient management decisions. A negative result may occur with  improper specimen collection/handling, submission of specimen other than nasopharyngeal swab, presence of viral mutation(s) within the areas targeted by this assay, and inadequate number of viral copies (<131 copies/mL). A negative result must be combined with clinical observations, patient history, and epidemiological information. The expected result is Negative.  Fact Sheet for Patients:  https://www.moore.com/  Fact Sheet for Healthcare Providers:  https://www.young.biz/  This test is no  t yet approved or cleared by the [place] FDA and  has been authorized for detection and/or diagnosis of SARS-CoV-2 by FDA under an Emergency Use Authorization (EUA). This EUA will remain  in effect (meaning this  test can be used) for the duration of the COVID-19 declaration under Section 564(b)(1) of the Act, 21 U.S.C. section 360bbb-3(b)(1), unless the authorization is terminated or revoked sooner.     Influenza A by PCR NEGATIVE NEGATIVE Final   Influenza B by PCR NEGATIVE NEGATIVE Final    Comment: (NOTE) The Xpert Xpress SARS-CoV-2/FLU/RSV assay is intended as an aid in  the diagnosis of influenza from Nasopharyngeal swab specimens and  should not be used as a sole basis for treatment. Nasal washings and  aspirates are unacceptable for Xpert Xpress SARS-CoV-2/FLU/RSV  testing.  Fact Sheet for Patients: https://www.moore.com/https://www.fda.gov/media/142436/download  Fact Sheet for Healthcare Providers: https://www.young.biz/https://www.fda.gov/media/142435/download  This test is not yet approved or cleared by the [place] FDA and  has been authorized for detection and/or diagnosis of SARS-CoV-2 by  FDA under an Emergency Use Authorization (EUA). This EUA will remain  in effect (meaning this test can be used) for the duration of the  Covid-19 declaration under Section 564(b)(1) of the Act, 21  U.S.C. section 360bbb-3(b)(1), unless the authorization is  terminated or revoked. Performed at [facility], [address] [place], [place] [address]     Radiology Reports CT PELVIS W CONTRAST  Result Date: 11/15/2019 CLINICAL DATA:  Perineal abscess EXAM: CT PELVIS WITH CONTRAST TECHNIQUE: Multidetector CT imaging of the pelvis was performed using the standard protocol following the bolus administration of intravenous contrast. CONTRAST:  125mL OMNIPAQUE IOHEXOL 300 MG/ML  SOLN COMPARISON:  None. FINDINGS: Urinary Tract:  No abnormality visualized. Bowel:  Unremarkable visualized pelvic bowel loops. Vascular/Lymphatic: No pathologically enlarged lymph nodes. No significant vascular abnormality seen. Reproductive:  No mass or other significant abnormality Other: There is subcutaneous induration within the left anterior  pubic soft tissues. No abscess or fluid collection. There are bilateral inguinal lymph nodes that are likely reactive. Musculoskeletal: No suspicious bone lesions identified. IMPRESSION: Subcutaneous induration within the left anterior pubic soft tissues, consistent with cellulitis. No abscess or fluid collection. Electronically Signed   By: Deatra RobinsonKevin  Herman M.D.   On: 11/15/2019 02:12    SIGNED: Kendell BaneSeyed A Mylani Gentry, MD, FACP, FHM. [facility],  Pager (please use [email or web address] to page/text)  If 7PM-7AM, please contact night-coverage [email or web address], Password [email or web address] 11/15/2019, 7:14 AM

## 2019-11-15 NOTE — H&P (Signed)
[place]   PATIENT NAME: Tony West    MR#:  [id number]  DATE OF BIRTH:  14-Jan-1981  DATE OF ADMISSION:  11/15/2019  PRIMARY CARE PHYSICIAN: [facility]   REQUESTING/REFERRING PHYSICIAN: Nita Sickle, MD  CHIEF COMPLAINT:   Chief Complaint  Patient presents with  . Abscess    HISTORY OF PRESENT ILLNESS:  Tony West  is a 39 y.o. African-American male with a known history of type II diabetes mellitus, hypertension and sleep apnea no longer using CPAP, who presented to the emergency room with acute onset of left groin pain with associated swelling and tenderness with induration over the last couple of days.  He was noted to have fever without chills or abdominal pain.  No nausea or vomiting.  No cough or wheezing or dyspnea or palpitations.  He has not been vaccinated for COVID-19.  He denies any COVID-19 recent exposure.  When he came to the ER temperature was 100.2 and heart rate 119 and blood pressure was later on 177/91.  Labs revealed hyperglycemia of 221 with otherwise unremarkable CMP.  Lactic acid was 1.7 and CBC showed leukocytosis of 14.2.  Respiratory panel is currently pending and blood cultures were sent.EKG showed sinus tachycardia with a rate of 105 with probable left atrial enlargement and incomplete right bundle branch block.  Pelvic CT scan with contrast revealed subcutaneous induration within the left inferior pubic soft tissues consistent with cellulitis with no abscess or fluid collection.  The patient was given empiric antibiotic therapy with IV cefepime, vancomycin and Flagyl as well as a bolus of 2.6 L of IV lactated Ringer, 4 mg of IV morphine sulfate and 4 mg of IV Zofran.  He will be admitted to a medical bed for further evaluation and management. PAST MEDICAL HISTORY:   Past Medical History:  Diagnosis Date  . Diabetes mellitus without complication (HCC)   . Hypertension   . Sleep apnea    CPAP prescribed but no  longer using  Tobacco abuse  PAST SURGICAL HISTORY:   Past Surgical History:  Procedure Laterality Date  . HERNIA REPAIR     ventral with mesh  . INSERTION OF MESH    . VENTRAL HERNIA REPAIR N/A 05/06/2017   Procedure: HERNIA REPAIR VENTRAL ADULT;  Surgeon: Carolan Shiver, MD;  Location: [facility] ORS;  Service: General;  Laterality: N/A;    SOCIAL HISTORY:   Social History   Tobacco Use  . Smoking status: Current Some Day Smoker    Packs/day: 0.50  . Smokeless tobacco: Never Used  Substance Use Topics  . Alcohol use: No    FAMILY HISTORY:   Family History  Problem Relation Age of Onset  . Diabetes Mother   . Diabetes Father     DRUG ALLERGIES:   Allergies  Allergen Reactions  . Shellfish Allergy Anaphylaxis    REVIEW OF SYSTEMS:   ROS As per history of present illness. All pertinent systems were reviewed above. Constitutional, HEENT, cardiovascular, respiratory, GI, GU, musculoskeletal, neuro, psychiatric, endocrine, integumentary and hematologic systems were reviewed and are otherwise negative/unremarkable except for positive findings mentioned above in the HPI.   MEDICATIONS AT HOME:   Prior to Admission medications   Medication Sig Start Date End Date Taking? Authorizing Provider  ciprofloxacin (CILOXAN) 0.3 % ophthalmic solution Administer 2 drop, every 6 hours to the LEFT EAR for seven days 06/11/17   Phineas Semen, MD  Empagliflozin (JARDIANCE PO) Take by mouth.    [provider]  EPINEPHrine 0.3 mg/0.3 mL IJ SOAJ injection Inject 0.3 mLs (0.3 mg total) into the muscle as needed for anaphylaxis. 10/10/19   Brimage, Seward Meth, DO  famotidine (PEPCID) 20 MG tablet Take 1 tablet (20 mg total) by mouth 2 (two) times daily. 10/10/19   Katha Cabal, DO  Insulin Glargine (LANTUS SOLOSTAR) 100 UNIT/ML Solostar Pen Inject 10 Units into the skin daily.    [provider]  lisinopril (PRINIVIL,ZESTRIL) 5 MG tablet Take 5 mg by mouth daily.     [provider]  loratadine (CLARITIN) 10 MG tablet Take 1 tablet (10 mg total) by mouth daily. 10/10/19 11/09/19  Katha Cabal, DO  metFORMIN (GLUCOPHAGE) 500 MG tablet Take 500 mg by mouth daily.    [provider]      VITAL SIGNS:  Blood pressure (!) 164/91, pulse (!) 101, temperature 100.2 F (37.9 C), temperature source Oral, resp. rate 20, height 6\' 3"  (1.905 m), weight (!) 166.5 kg, SpO2 96 %.  PHYSICAL EXAMINATION:  Physical Exam  GENERAL:  39 y.o.-year-old obese African-American male patient lying in the bed with no acute distress.  EYES: Pupils equal, round, reactive to light and accommodation. No scleral icterus. Extraocular muscles intact.  HEENT: Head atraumatic, normocephalic. Oropharynx and nasopharynx clear.  NECK:  Supple, no jugular venous distention. No thyroid enlargement, no tenderness.  LUNGS: Normal breath sounds bilaterally, no wheezing, rales,rhonchi or crepitation. No use of accessory muscles of respiration.  CARDIOVASCULAR: Regular rate and rhythm, S1, S2 normal. No murmurs, rubs, or gallops.  ABDOMEN: Soft, nondistended, nontender. Bowel sounds present. No organomegaly or mass.  EXTREMITIES: No pedal edema, cyanosis, or clubbing.  NEUROLOGIC: Cranial nerves II through XII are intact. Muscle strength 5/5 in all extremities. Sensation intact. Gait not checked.  PSYCHIATRIC: The patient is alert and oriented x 3.  Normal affect and good eye contact. SKIN: Left groin swelling with erythema, warmth, induration and tenderness without fluctuation. LABORATORY PANEL:   CBC Recent Labs  Lab 11/14/19 2259  WBC 14.2*  HGB 15.8  HCT 48.3  PLT 183   ------------------------------------------------------------------------------------------------------------------  Chemistries  Recent Labs  Lab 11/14/19 2259  NA 135  K 4.2  CL 99  CO2 25  GLUCOSE 221*  BUN 14  CREATININE 0.94  CALCIUM 9.2  AST 16  ALT 20  ALKPHOS 86  BILITOT 1.0    ------------------------------------------------------------------------------------------------------------------  Cardiac Enzymes No results for input(s): TROPONINI in the last 168 hours. ------------------------------------------------------------------------------------------------------------------  RADIOLOGY:  CT PELVIS W CONTRAST  Result Date: 11/15/2019 CLINICAL DATA:  Perineal abscess EXAM: CT PELVIS WITH CONTRAST TECHNIQUE: Multidetector CT imaging of the pelvis was performed using the standard protocol following the bolus administration of intravenous contrast. CONTRAST:  11/17/2019 OMNIPAQUE IOHEXOL 300 MG/ML  SOLN COMPARISON:  None. FINDINGS: Urinary Tract:  No abnormality visualized. Bowel:  Unremarkable visualized pelvic bowel loops. Vascular/Lymphatic: No pathologically enlarged lymph nodes. No significant vascular abnormality seen. Reproductive:  No mass or other significant abnormality Other: There is subcutaneous induration within the left anterior pubic soft tissues. No abscess or fluid collection. There are bilateral inguinal lymph nodes that are likely reactive. Musculoskeletal: No suspicious bone lesions identified. IMPRESSION: Subcutaneous induration within the left anterior pubic soft tissues, consistent with cellulitis. No abscess or fluid collection. Electronically Signed   By: M.D.   On: 11/15/2019 02:12      IMPRESSION AND PLAN:   1.  Left groin severe nonpurulent cellulitis with subsequent sepsis without severe sepsis.  Sepsis is manifested  by leukocytosis and tachycardia. -The patient will be admitted to a medical bed. -We will continue antibiotic therapy with IV cefepime, vancomycin and Flagyl. -Warm compresses will be utilized. -We will follow lactic acid level.  2.  Uncontrolled type 2 diabetes mellitus. -This is likely secondary to #1. -We will place him on supplement coverage with NovoLog and continue Jardiance and basal coverage.  3.   Hypertensive urgency. -Continues antihypertensives and place him on as needed IV labetalol.  4.  DVT prophylaxis. -Subcutaneous Lovenox    All the records are reviewed and case discussed with ED provider. The plan of care was discussed in details with the patient (and family). I answered all questions. The patient agreed to proceed with the above mentioned plan. Further management will depend upon hospital course.   CODE STATUS: Full code  Status is: Inpatient  Remains inpatient appropriate because:Ongoing active pain requiring inpatient pain management, Ongoing diagnostic testing needed not appropriate for outpatient work up, Unsafe d/c plan, IV treatments appropriate due to intensity of illness or inability to take PO and Inpatient level of care appropriate due to severity of illness   Dispo: The patient is from: Home              Anticipated d/c is to: Home              Anticipated d/c date is: 2 days              Patient currently is not medically stable to d/c.   TOTAL TIME TAKING CARE OF THIS PATIENT: 55 minutes.    Hannah Beat M.D on 11/15/2019 at 5:33 AM  [facility]   From 7 PM-7 AM, contact night-coverage [email or web address]  CC: Primary care physician; [facility]

## 2019-11-15 NOTE — Progress Notes (Signed)
CODE SEPSIS - PHARMACY COMMUNICATION  **Broad Spectrum Antibiotics should be administered within 1 hour of Sepsis diagnosis**  Time Code Sepsis Called/Page Received: 0314  Antibiotics Ordered: Vancomycin, Cefepime, Flagyl  Time of 1st antibiotic administration: 0339  Additional action taken by pharmacy: n/a  If necessary, Name of Provider/Nurse Contacted: n/a    Wayland Denis ,PharmD Clinical Pharmacist  11/15/2019  3:15 AM

## 2019-11-15 NOTE — Progress Notes (Signed)
Pharmacy Antibiotic Note  Tony West is a 39 y.o. male admitted on 11/15/2019 with cellulitis.  Pharmacy has been consulted for Vancomycin and Cefepime dosing.  Plan: Cefepime 2gm IV q8hrs Vancomycin 1500mg  IV q8hrs (per nomogram)  Height: 6\' 3"  (190.5 cm) Weight: (!) 166.5 kg (367 lb) IBW/kg (Calculated) : 84.5  Temp (24hrs), Avg:100.2 F (37.9 C), Min:100.2 F (37.9 C), Max:100.2 F (37.9 C)  Recent Labs  Lab 11/14/19 2259  WBC 14.2*  CREATININE 0.94  LATICACIDVEN 1.7    Estimated Creatinine Clearance: 171.6 mL/min (by C-G formula based on SCr of 0.94 mg/dL).    Allergies  Allergen Reactions  . Shellfish Allergy Anaphylaxis    Antimicrobials this admission:   >>    >>   Dose adjustments this admission:   Microbiology results:  BCx:   UCx:    Sputum:    MRSA PCR:   Thank you for allowing pharmacy to be a part of this patient's care.  A 11/15/2019 5:50 AM

## 2019-11-16 ENCOUNTER — Encounter: Payer: Self-pay | Admitting: Family Medicine

## 2019-11-16 DIAGNOSIS — L0390 Cellulitis, unspecified: Secondary | ICD-10-CM | POA: Diagnosis not present

## 2019-11-16 DIAGNOSIS — A419 Sepsis, unspecified organism: Secondary | ICD-10-CM | POA: Diagnosis not present

## 2019-11-16 DIAGNOSIS — L03314 Cellulitis of groin: Secondary | ICD-10-CM | POA: Diagnosis not present

## 2019-11-16 LAB — URINALYSIS, ROUTINE W REFLEX MICROSCOPIC
Bacteria, UA: NONE SEEN
Bilirubin Urine: NEGATIVE
Glucose, UA: >=500 mg/dL — AB
Hgb urine dipstick: NEGATIVE
Ketones, ur: NEGATIVE mg/dL
Leukocytes,Ua: NEGATIVE
Nitrite: NEGATIVE
Protein, ur: NEGATIVE mg/dL
Specific Gravity, Urine: 1.025 (ref 1.005–1.030)
Squamous Epithelial / HPF: NONE SEEN (ref 0–5)
pH: 6 (ref 5.0–8.0)

## 2019-11-16 LAB — PROTIME-INR
INR: 1.1 (ref 0.8–1.2)
Prothrombin Time: 13.8 seconds (ref 11.4–15.2)

## 2019-11-16 LAB — CBC
HCT: 42.9 % (ref 39.0–52.0)
Hemoglobin: 13.9 g/dL (ref 13.0–17.0)
MCH: 26.8 pg (ref 26.0–34.0)
MCHC: 32.4 g/dL (ref 30.0–36.0)
MCV: 82.8 fL (ref 80.0–100.0)
Platelets: 162 10*3/uL (ref 150–400)
RBC: 5.18 MIL/uL (ref 4.22–5.81)
RDW: 13.9 % (ref 11.5–15.5)
WBC: 11.6 10*3/uL — ABNORMAL HIGH (ref 4.0–10.5)
nRBC: 0 % (ref 0.0–0.2)

## 2019-11-16 LAB — HEMOGLOBIN A1C
Hgb A1c MFr Bld: 12.3 % — ABNORMAL HIGH (ref 4.8–5.6)
Mean Plasma Glucose: 306 mg/dL

## 2019-11-16 LAB — BASIC METABOLIC PANEL
Anion gap: 9 (ref 5–15)
BUN: 12 mg/dL (ref 6–20)
CO2: 25 mmol/L (ref 22–32)
Calcium: 8.2 mg/dL — ABNORMAL LOW (ref 8.9–10.3)
Chloride: 104 mmol/L (ref 98–111)
Creatinine, Ser: 0.8 mg/dL (ref 0.61–1.24)
GFR, Estimated: >60 mL/min (ref >60)
Glucose, Bld: 295 mg/dL — ABNORMAL HIGH (ref 70–99)
Potassium: 4 mmol/L (ref 3.5–5.1)
Sodium: 138 mmol/L (ref 135–145)

## 2019-11-16 LAB — CORTISOL-AM, BLOOD: Cortisol - AM: 5.1 ug/dL — ABNORMAL LOW (ref 6.7–22.6)

## 2019-11-16 LAB — GLUCOSE, CAPILLARY
Glucose-Capillary: 336 mg/dL — ABNORMAL HIGH (ref 70–99)
Glucose-Capillary: 307 mg/dL — ABNORMAL HIGH (ref 70–99)

## 2019-11-16 LAB — PROCALCITONIN: Procalcitonin: <0.1 ng/mL

## 2019-11-16 MED ORDER — LACTINEX PO CHEW: 1.0000 | CHEWABLE_TABLET | Freq: Three times a day (TID) | ORAL | 0 refills | Status: AC

## 2019-11-16 MED ORDER — CLINDAMYCIN HCL 300 MG PO CAPS: 300.0000 mg | ORAL_CAPSULE | Freq: Three times a day (TID) | ORAL | 0 refills | Status: DC

## 2019-11-16 MED ORDER — METFORMIN HCL 500 MG PO TABS: 500.0000 mg | ORAL_TABLET | Freq: Every day | ORAL | 1 refills | Status: AC

## 2019-11-16 NOTE — Discharge Summary (Signed)
Physician Discharge Summary [facility]    Patient: Tony West                   Admit date: 11/15/2019   DOB: 1980/10/29             Discharge date:11/16/2019/10:28 AM POE:[id number]                          [facility]P: [facility]  Disposition: HOME   Recommendations for Outpatient Follow-up:   . Follow up: in 1 week  Discharge Condition: Stable   Code Status:   Code Status: Full Code  Diet recommendation: Diabetic diet   Discharge Diagnoses:    Active Problems:   Sepsis due to cellulitis [facility])   History of Present Illness/ Hospital Course Charline Bills Summary:    [name] a39 y.o.African-American malewith a known history of type II diabetes mellitus, hypertension and sleep apnea no longer using CPAP, who presented to the emergency room with acute onset of left groin pain with associated swelling and tenderness with induration over the last couple of days.  He was noted to have fever without chills or abdominal pain.   He has not been vaccinated for COVID-19. He denies any COVID-19 recent exposure.  ER  Temp.was 100.2 and heart rate 119, BP 177/91. Labs revealed hyperglycemia of 221 with otherwise unremarkable CMP. Lactic acid was 1.7 and CBC showed leukocytosis of 14.2.  Respiratory panel negative influ. A/Bm COVID were negative. Blood cultures were sent. EKG showed sinus tachycardia with a rate of 105 with probable left atrial enlargement and incomplete right bundle branch block. Pelvic CT scan with contrast revealed subcutaneous induration within the left inferior pubic soft tissues consistent with cellulitis with no abscess or fluid collection.  The patient was given empiric antibiotic therapy with IV cefepime, vancomycin and Flagyl as well as a bolus of 2.6 L of IV lactated Ringer, 4 mg of IV morphine sulfate and 4 mg of IV Zofran.     Left groin severe nonpurulent cellulitis with subsequent sepsis without severe  sepsis.  Sepsis is manifested by leukocytosis and tachycardia. -Hemodynamically remained stable -Improved sepsis physiology: T-max 100.2, PR 93 RR 16 BP 155/78, LA 1.7, WBC 14.2 -Tenderness left groin area, erythema edema -We will continue IV cefepime, vancomycin and Flagyl. X 2 days ... We will be transitioning to p.o. antibiotics clindamycin 3 mg p.o. 3 times daily x10 more days -Warm compresses will be utilized. -Improved erythema edema of the left groin   Patient insistent on being discharged--- pros and cons of inadequate incomplete treatment of the above were discussed with the patient in detail he expressed understanding.  He is to follow-up with PCP closely, continue current recommended antibiotics   Uncontrolled type 2 diabetes mellitus. -Checking blood sugar QA CHS, with SSI coverage -Resuming home medication -This is likely secondary to #1. -continue Jardiance and basal coverage. -A1c: 12.3 -Discussed with patient in detail regarding uncontrolled diabetes mellitus type 2, currently on Lantus 50 units nightly, Jardiance 10 mg daily... We recommended to resume Metformin 5 mg daily He is to keep a log of his blood sugars, follow-up with PCP for better glycemic control.  Hypertensive urgency. -Continues antihypertensives and place him on as needed IV labetalol. -Home medication of lisinopril increased from 5 to 20 mg daily, -His blood pressure has normalized-remained stable      Cultures; Blood Cultures x 2 >>  no growth to date x2 days  Antimicrobials: 11/15/2019 IV cefepime, vancomycin and Flagyl.  Ending 11/16/2019 11/17/2019 clindamycin 300 g p.o. 3 times daily x10 days     Nutritional status:          Discharge Instructions:   Discharge Instructions    Activity as tolerated - No restrictions   Complete by: As directed    Call MD for:  redness, tenderness, or signs of infection (pain, swelling, redness, odor or green/yellow discharge around  incision site)   Complete by: As directed    Call MD for:  temperature >100.4   Complete by: As directed    Diet - low sodium heart healthy   Complete by: As directed    Discharge instructions   Complete by: As directed    F/up with PCP   Increase activity slowly   Complete by: As directed        Medication List    STOP taking these medications   famotidine 20 MG tablet Commonly known as: PEPCID     TAKE these medications   ciprofloxacin 0.3 % ophthalmic solution Commonly known as: CILOXAN Administer 2 drop, every 6 hours to the LEFT EAR for seven days   clindamycin 300 MG capsule Commonly known as: CLEOCIN Take 1 capsule (300 mg total) by mouth 3 (three) times daily for 10 days.   EPINEPHrine 0.3 mg/0.3 mL Soaj injection Commonly known as: EPI-PEN Inject 0.3 mLs (0.3 mg total) into the muscle as needed for anaphylaxis.   Jardiance 10 MG Tabs tablet Generic drug: empagliflozin Take 10 mg by mouth daily.   lactobacillus acidophilus & bulgar chewable tablet Chew 1 tablet by mouth 3 (three) times daily with meals for 10 days.   Lantus SoloStar 100 UNIT/ML Solostar Pen Generic drug: insulin glargine Inject 50 Units into the skin daily.   lisinopril 5 MG tablet Commonly known as: ZESTRIL Take 5 mg by mouth daily.   metFORMIN 500 MG tablet Commonly known as: GLUCOPHAGE Take 1 tablet (500 mg total) by mouth daily.       Allergies  Allergen Reactions  . Shellfish Allergy Anaphylaxis     Procedures /Studies:   CT PELVIS W CONTRAST  Result Date: 11/15/2019 CLINICAL DATA:  Perineal abscess EXAM: CT PELVIS WITH CONTRAST TECHNIQUE: Multidetector CT imaging of the pelvis was performed using the standard protocol following the bolus administration of intravenous contrast. CONTRAST:  OMNIPAQUE IOHEXOL 300 MG/ML  SOLN COMPARISON:  None. FINDINGS: Urinary Tract:  No abnormality visualized. Bowel:  Unremarkable visualized pelvic bowel loops. Vascular/Lymphatic:  No pathologically enlarged lymph nodes. No significant vascular abnormality seen. Reproductive:  No mass or other significant abnormality Other: There is subcutaneous induration within the left anterior pubic soft tissues. No abscess or fluid collection. There are bilateral inguinal lymph nodes that are likely reactive. Musculoskeletal: No suspicious bone lesions identified. IMPRESSION: Subcutaneous induration within the left anterior pubic soft tissues, consistent with cellulitis. No abscess or fluid collection. Electronically Signed   By: Deatra Robinson M.D.   On: 11/15/2019 02:12    Subjective:   Patient was seen and examined 11/16/2019, 10:28 AM Patient stable today. No acute distress.  No issues overnight Stable for discharge.  Discharge Exam:    Vitals:   11/16/19 0230 11/16/19 0301 11/16/19 0322 11/16/19 0844  BP: 138/81  (!) 150/84 136/88  Pulse: 97  (!) 101 98  Resp: 14  19   Temp:  99.4 F (37.4 C) 98.8 F (37.1 C) 98.8 F (37.1 C)  TempSrc:  Oral Oral Oral  SpO2: 98%  96% 98%  Weight:   (!) 170.5 kg   Height:   6\' 3"  (1.905 m)     General: Pt lying comfortably in bed & appears in no obvious distress. Cardiovascular: S1 & S2 heard, RRR, S1/S2 +. No murmurs, rubs, gallops or clicks. No JVD or pedal edema. Respiratory: Clear to auscultation without wheezing, rhonchi or crackles. No increased work of breathing. Abdominal:  Non-distended, non-tender & soft. No organomegaly or masses appreciated. Normal bowel sounds heard. CNS: Alert and oriented. No focal deficits. Extremities: no edema, no cyanosis Skin: Left groin edema erythema tenderness--- negative any open wound or rash   The results of significant diagnostics from this hospitalization (including imaging, microbiology, ancillary and laboratory) are listed below for reference.      Microbiology:   Recent Results (from the past 240 hour(s))  Blood culture (routine x 2)     Status: None (Preliminary result)    Collection Time: 11/14/19 10:59 PM   Specimen: BLOOD  Result Value Ref Range Status   Specimen Description BLOOD RIGHT [facility]  Final   Special Requests   Final    BOTTLES DRAWN AEROBIC AND ANAEROBIC Blood Culture adequate volume   Culture   Final    NO GROWTH 1 DAY Performed at [facility], [address], [place], [place]    Report Status PENDING  Incomplete  Blood culture (routine x 2)     Status: None (Preliminary result)   Collection Time: 11/15/19  3:42 AM   Specimen: BLOOD  Result Value Ref Range Status   Specimen Description BLOOD LEFT [facility]  Final   Special Requests   Final    BOTTLES DRAWN AEROBIC AND ANAEROBIC Blood Culture adequate volume   Culture   Final    NO GROWTH 1 DAY Performed at [facility], [address], [place], [place]    Report Status PENDING  Incomplete  Respiratory Panel by RT PCR (Flu A&B, Covid) - Nasopharyngeal Swab     Status: None   Collection Time: 11/15/19  3:42 AM   Specimen: Nasopharyngeal Swab  Result Value Ref Range Status   SARS Coronavirus 2 by RT PCR NEGATIVE NEGATIVE Final    Comment: (NOTE) SARS-CoV-2 target nucleic acids are NOT DETECTED.  The SARS-CoV-2 RNA is generally detectable in upper respiratoy specimens during the acute phase of infection. The lowest concentration of SARS-CoV-2 viral copies this assay can detect is 131 copies/mL. A negative result does not preclude SARS-Cov-2 infection and should not be used as the sole basis for treatment or other patient management decisions. A negative result may occur with  improper specimen collection/handling, submission of specimen other than nasopharyngeal swab, presence of viral mutation(s) within the areas targeted by this assay, and inadequate number of viral copies (<131 copies/mL). A negative result must be combined with clinical observations, patient history, and epidemiological information. The expected result is Negative.  Fact Sheet for  Patients:  11/17/19  Fact Sheet for Healthcare Providers:  https://www.moore.com/  This test is no t yet approved or cleared by the https://www.young.biz/ FDA and  has been authorized for detection and/or diagnosis of SARS-CoV-2 by FDA under an Emergency Use Authorization (EUA). This EUA will remain  in effect (meaning this test can be used) for the duration of the COVID-19 declaration under Section 564(b)(1) of the Act, 21 U.S.C. section 360bbb-3(b)(1), unless the authorization is terminated or revoked sooner.     Influenza A by PCR NEGATIVE NEGATIVE Final  Influenza B by PCR NEGATIVE NEGATIVE Final    Comment: (NOTE) The Xpert Xpress SARS-CoV-2/FLU/RSV assay is intended as an aid in  the diagnosis of influenza from Nasopharyngeal swab specimens and  should not be used as a sole basis for treatment. Nasal washings and  aspirates are unacceptable for Xpert Xpress SARS-CoV-2/FLU/RSV  testing.  Fact Sheet for Patients: https://www.moore.com/https://www.fda.gov/media/142436/download  Fact Sheet for Healthcare Providers: https://www.young.biz/https://www.fda.gov/media/142435/download  This test is not yet approved or cleared by the [place] FDA and  has been authorized for detection and/or diagnosis of SARS-CoV-2 by  FDA under an Emergency Use Authorization (EUA). This EUA will remain  in effect (meaning this test can be used) for the duration of the  Covid-19 declaration under Section 564(b)(1) of the Act, 21  U.S.C. section 360bbb-3(b)(1), unless the authorization is  terminated or revoked. Performed at [facility], [address] [place], [place] [address]      Labs:   CBC: Recent Labs  Lab 11/14/19 2259 11/16/19 0352  WBC 14.2* 11.6*  HGB 15.8 13.9  HCT 48.3 42.9  MCV 82.3 82.8  PLT 183 162   Basic Metabolic Panel: Recent Labs  Lab 11/14/19 2259 11/16/19 0352  NA 135 138  K 4.2 4.0  CL 99 104  CO2 25 25  GLUCOSE 221* 295*  BUN 14 12   CREATININE 0.94 0.80  CALCIUM 9.2 8.2*   Liver Function Tests: Recent Labs  Lab 11/14/19 2259  AST 16  ALT 20  ALKPHOS 86  BILITOT 1.0  PROT 7.8  ALBUMIN 4.3   BNP (last 3 results) No results for input(s): BNP in the last 8760 hours. Cardiac Enzymes: No results for input(s): CKTOTAL, CKMB, CKMBINDEX, TROPONINI in the last 168 hours. CBG: Recent Labs  Lab 11/15/19 0824 11/15/19 1149 11/15/19 1815 11/15/19 2107 11/16/19 0801  GLUCAP 286* 370* 323* 290* 336*   Hgb A1c Recent Labs    11/15/19 0625  HGBA1C 12.3*   Lipid Profile No results for input(s): CHOL, HDL, LDLCALC, TRIG, CHOLHDL, LDLDIRECT in the last 72 hours. Thyroid function studies No results for input(s): TSH, T4TOTAL, T3FREE, THYROIDAB in the last 72 hours.  Invalid input(s): FREET3 Anemia work up No results for input(s): VITAMINB12, FOLATE, FERRITIN, TIBC, IRON, RETICCTPCT in the last 72 hours. Urinalysis    Component Value Date/Time   COLORURINE YELLOW (A) 09/26/2016 0632   APPEARANCEUR CLEAR (A) 09/26/2016 0632   APPEARANCEUR Clear 02/27/2012 0242   LABSPEC 1.019 09/26/2016 0632   LABSPEC 1.023 02/27/2012 0242   PHURINE 6.0 09/26/2016 0632   GLUCOSEU NEGATIVE 09/26/2016 0632   GLUCOSEU Negative 02/27/2012 0242   HGBUR NEGATIVE 09/26/2016 0632   BILIRUBINUR NEGATIVE 09/26/2016 0632   BILIRUBINUR Negative 02/27/2012 0242   KETONESUR NEGATIVE 09/26/2016 0632   PROTEINUR NEGATIVE 09/26/2016 0632   NITRITE NEGATIVE 09/26/2016 0632   LEUKOCYTESUR NEGATIVE 09/26/2016 0632   LEUKOCYTESUR Negative 02/27/2012 0242         Time coordinating discharge: Over 45 minutes  SIGNED: Kendell BaneSeyed A Aliyha Fornes, MD, FACP, FHM. [facility],  Please use [email or web address] to Page If 7PM-7AM, please contact night-coverage [email or web address], Password [email or web address] 11/16/2019, 10:28 AM

## 2019-11-16 NOTE — Progress Notes (Signed)
Floyed B Kolasinski A and O x4. VSS. Pt tolerating diet well. No complaints of nausea or vomiting. IV removed intact, prescriptions given. Pt voices understanding of discharge instructions with no further questions. Patient discharged via wheelchair with volunteer  Allergies as of 11/16/2019      Reactions   Shellfish Allergy Anaphylaxis      Medication List    STOP taking these medications   famotidine 20 MG tablet Commonly known as: PEPCID     TAKE these medications   ciprofloxacin 0.3 % ophthalmic solution Commonly known as: CILOXAN Administer 2 drop, every 6 hours to the LEFT EAR for seven days   clindamycin 300 MG capsule Commonly known as: CLEOCIN Take 1 capsule (300 mg total) by mouth 3 (three) times daily for 10 days.   EPINEPHrine 0.3 mg/0.3 mL Soaj injection Commonly known as: EPI-PEN Inject 0.3 mLs (0.3 mg total) into the muscle as needed for anaphylaxis.   Jardiance 10 MG Tabs tablet Generic drug: empagliflozin Take 10 mg by mouth daily.   lactobacillus acidophilus & bulgar chewable tablet Chew 1 tablet by mouth 3 (three) times daily with meals for 10 days.   Lantus SoloStar 100 UNIT/ML Solostar Pen Generic drug: insulin glargine Inject 50 Units into the skin daily.   lisinopril 5 MG tablet Commonly known as: ZESTRIL Take 5 mg by mouth daily.   metFORMIN 500 MG tablet Commonly known as: GLUCOPHAGE Take 1 tablet (500 mg total) by mouth daily.       Vitals:   11/16/19 0844 11/16/19 1208  BP: 136/88 137/82  Pulse: 98 93  Resp:    Temp: 98.8 F (37.1 C) 97.6 F (36.4 C)  SpO2: 98% 93%    Bertha Stakes

## 2019-11-16 NOTE — Progress Notes (Signed)
Inpatient Diabetes Program Recommendations  AACE/ADA: New Consensus Statement on Inpatient Glycemic Control (2015)  Target Ranges:  Prepandial:   less than 140 mg/dL      Peak postprandial:   less than 180 mg/dL (1-2 hours)      Critically ill patients:  140 - 180 mg/dL   Lab Results  Component Value Date   GLUCAP 307 (H) 11/16/2019   HGBA1C 12.3 (H) 11/15/2019    Review of Glycemic Control  Diabetes history: DM2 Outpatient Diabetes medications: Lantus 20-30 units if CBG elevated + Jardiance 10 mg Current orders for Inpatient glycemic control: Lantus 15 units + Novolog 0-15 correction qid post prandial  Inpatient Diabetes Program Recommendations:    -Consider Lantus 34 = 0.2 units/kg x 170.5 kg Sent secure chat to Dr. Flossie Dibble with recommendations. Spoke with patient @ bedside regarding A1c 12.3 (average blood glucose of 306 over the past 2-3 months). Explained what an A1C is, basic pathophysiology of DM Type 2, basic home care, basic diabetes diet nutrition principles, importance of checking CBGs and maintaining good CBG control to prevent long-term and short-term complications. Reviewed signs and symptoms of hyperglycemia and hypoglycemia and how to treat hypoglycemia at home. Also reviewed blood sugar goals at home.  Patient lives @ home with his girlfriend and 3 children and he normally cooks for the family meals. Reviewed importance of plate method limiting processed foods, sugary drinks, breads, pasta and other high glycemic index foods. Patient states willingness to start watching his intake more closely, checking CBGs and following up with his family doctor. Patient is getting ready to discharge home.   Thank you, Tony West. Ieshia Hatcher, RN, MSN, CDE  Diabetes Coordinator Inpatient Glycemic Control Team Team Pager [phone] (8am-5pm) 11/16/2019 2:44 PM

## 2019-11-19 ENCOUNTER — Ambulatory Visit: Payer: Self-pay

## 2019-11-20 ENCOUNTER — Encounter: Payer: Self-pay | Admitting: Emergency Medicine

## 2019-11-20 ENCOUNTER — Ambulatory Visit
Admission: EM | Admit: 2019-11-20 | Discharge: 2019-11-20 | Disposition: A | Discharge status: Home / Self Care | Payer: Medicaid Other | Attending: Family Medicine | Admitting: Family Medicine

## 2019-11-20 ENCOUNTER — Other Ambulatory Visit: Payer: Self-pay

## 2019-11-20 ENCOUNTER — Ambulatory Visit: Payer: Self-pay

## 2019-11-20 DIAGNOSIS — L03314 Cellulitis of groin: Secondary | ICD-10-CM

## 2019-11-20 LAB — CULTURE, BLOOD (ROUTINE X 2)
Culture: NO GROWTH
Culture: NO GROWTH
Special Requests: ADEQUATE
Special Requests: ADEQUATE

## 2019-11-20 MED ORDER — AMOXICILLIN-POT CLAVULANATE 875-125 MG PO TABS: 1.0000 | ORAL_TABLET | Freq: Two times a day (BID) | ORAL | 0 refills | Status: DC

## 2019-11-20 MED ORDER — DOXYCYCLINE HYCLATE 100 MG PO CAPS: 100.0000 mg | ORAL_CAPSULE | Freq: Two times a day (BID) | ORAL | 0 refills | Status: DC

## 2019-11-20 NOTE — ED Triage Notes (Signed)
Patient c/o abscess to groin area x 1 week. Patient was seen in ER and had a CT scan done on the area. He was admitted to the hospital and given IV antibiotics. He was discharged on 10/14. He wanted them to open up the abscess but he states they declined. He returns today requesting the abscess to be opened.

## 2019-11-20 NOTE — Discharge Instructions (Signed)
Warm compresses/soaks.  Antibiotics as prescribed.  If you worsen, please return to the hospital.  Keep wound clean.  Take care  Dr. Adriana Simas

## 2019-11-21 DIAGNOSIS — L03314 Cellulitis of groin: Secondary | ICD-10-CM | POA: Diagnosis not present

## 2019-11-21 NOTE — ED Provider Notes (Signed)
[facility]    CSN: [id number] Arrival date & time: 11/20/19  1150  History   Chief Complaint Chief Complaint  Patient presents with  . Appointment    12:00  . Recurrent Skin Infections   HPI  39 year old male presents with abscess.  Patient recently admitted for sepsis secondary to cellulitis (left groin) - 10/13 >> 10/14. CT revealed no evidence of fluid collection or abscess. He was treating with IV antibiotics and was discharged home on Clindamycin.    Patient states that the area is worsening. He states that it has now come to a "head". He believes that he has an abscess and is requesting drainage today. He has been out of antibiotics for a few days (he states that he cannot find them; he believes that it was misplaced by his children).  No current fever. Pain 10/10 in severity.  No relieving factors.   Past Medical History:  Diagnosis Date  . Diabetes mellitus without complication (HCC)   . Hypertension   . Sleep apnea    CPAP prescribed but no longer using    Patient Active Problem List   Diagnosis Date Noted  . Sepsis due to cellulitis (HCC) 11/15/2019    Past Surgical History:  Procedure Laterality Date  . HERNIA REPAIR     ventral with mesh  . INSERTION OF MESH    . VENTRAL HERNIA REPAIR N/A 05/06/2017   Procedure: HERNIA REPAIR VENTRAL ADULT;  Surgeon: Carolan Shiver, MD;  Location: [facility] ORS;  Service: General;  Laterality: N/A;       Home Medications    Prior to Admission medications   Medication Sig Start Date End Date Taking? Authorizing Provider  empagliflozin (JARDIANCE) 10 MG TABS tablet Take 10 mg by mouth daily.    Yes [provider]  EPINEPHrine 0.3 mg/0.3 mL IJ SOAJ injection Inject 0.3 mLs (0.3 mg total) into the muscle as needed for anaphylaxis. 10/10/19  Yes Brimage, Vondra, DO  Insulin Glargine (LANTUS SOLOSTAR) 100 UNIT/ML Solostar Pen Inject 50 Units into the skin daily.    Yes [provider]    lactobacillus acidophilus & bulgar (LACTINEX) chewable tablet Chew 1 tablet by mouth 3 (three) times daily with meals for 10 days. 11/16/19 11/26/19 Yes Shahmehdi, Seyed A, MD  lisinopril (PRINIVIL,ZESTRIL) 5 MG tablet Take 5 mg by mouth daily.   Yes [provider]  metFORMIN (GLUCOPHAGE) 500 MG tablet Take 1 tablet (500 mg total) by mouth daily. 11/16/19 12/16/19 Yes Shahmehdi, Gemma Payor, MD  amoxicillin-clavulanate (AUGMENTIN) 875-125 MG tablet Take 1 tablet by mouth 2 (two) times daily. 11/20/19   Tommie Sams, DO  doxycycline (VIBRAMYCIN) 100 MG capsule Take 1 capsule (100 mg total) by mouth 2 (two) times daily. 11/20/19   Tommie Sams, DO    Family History Family History  Problem Relation Age of Onset  . Diabetes Mother   . Diabetes Father     Social History Social History   Tobacco Use  . Smoking status: Current Some Day Smoker    Packs/day: 0.50  . Smokeless tobacco: Never Used  Vaping Use  . Vaping Use: Some days  Substance Use Topics  . Alcohol use: No  . Drug use: No     Allergies   Shellfish allergy   Review of Systems Review of Systems  Skin:       Swelling, pain - left groin.    Physical Exam Triage Vital Signs ED Triage Vitals  Enc Vitals  Group     BP 11/20/19 1223 (!) 159/130     Pulse Rate 11/20/19 1223 93     Resp 11/20/19 1223 19     Temp 11/20/19 1223 98.7 F (37.1 C)     Temp Source 11/20/19 1223 Oral     SpO2 11/20/19 1223 99 %     Weight 11/20/19 1221 (!) 375 lb 14.2 oz (170.5 kg)     Height 11/20/19 1221 6\' 3"  (1.905 m)     Head Circumference --      Peak Flow --      Pain Score 11/20/19 1221 10     Pain Loc --      Pain Edu? --      Excl. in GC? --    No data found.  Updated Vital Signs BP (!) 159/130 (BP Location: Right Arm)   Pulse 93   Temp 98.7 F (37.1 C) (Oral)   Resp 19   Ht 6\' 3"  (1.905 m)   Wt (!) 170.5 kg   SpO2 99%   BMI 46.98 kg/m   Visual Acuity Right Eye Distance:   Left Eye Distance:    Bilateral Distance:    Right Eye Near:   Left Eye Near:    Bilateral Near:     Physical Exam Constitutional:      General: He is not in acute distress.    Appearance: He is not ill-appearing.     Comments: Morbidly obese.  HENT:     Head: Normocephalic and atraumatic.  Pulmonary:     Effort: Pulmonary effort is normal. No respiratory distress.  Skin:    Comments: Left groin - Large tender erythematous area. Indurated.   Neurological:     Mental Status: He is alert.  Psychiatric:        Mood and Affect: Mood normal.        Behavior: Behavior normal.    UC Treatments / Results  Labs (all labs ordered are listed, but only abnormal results are displayed) Labs Reviewed  AEROBIC CULTURE (SUPERFICIAL SPECIMEN)    EKG   Radiology No results found.  Procedures Incision and Drainage  Date/Time: 11/21/2019 10:50 PM Performed by: , DO Authorized by: 11/23/2019, DO   Consent:    Consent obtained:  Verbal   Consent given by:  Patient Location:    Type:  Abscess   Location:  Lower extremity   Lower extremity location: Left groin. Pre-procedure details:    Skin preparation:  Betadine Anesthesia (see MAR for exact dosages):    Anesthesia method:  Local infiltration   Local anesthetic:  Lidocaine 1% WITH epi Procedure type:    Complexity:  Simple Procedure details:    Incision types:  Stab incision   Scalpel blade:  11   Drainage:  Bloody   Drainage amount:  Moderate   Wound treatment:  Wound left open   Packing materials:  None Post-procedure details:    Patient tolerance of procedure:  Tolerated with difficulty   (including critical care time)  Medications Ordered in UC Medications - No data to display  Initial Impression / Assessment and Plan / UC Course  I have reviewed the triage vital signs and the nursing notes.  Pertinent labs & imaging results that were available during my care of the patient were reviewed by me and considered in  my medical decision making (see chart for details).    39 year old male presents with ongoing cellulitis. Concern for abscess.  I & D was performed. No purulent drainage (just blood). Treating with Augmentin & Doxy.  Final Clinical Impressions(s) / UC Diagnoses   Final diagnoses:  Cellulitis of groin     Discharge Instructions     Warm compresses/soaks.  Antibiotics as prescribed.  If you worsen, please return to the hospital.  Keep wound clean.  Take care  Dr. Adriana Simas    ED Prescriptions    Medication Sig Dispense Auth. Provider   doxycycline (VIBRAMYCIN) 100 MG capsule Take 1 capsule (100 mg total) by mouth 2 (two) times daily. 20 capsule Declyn Delsol G, DO   amoxicillin-clavulanate (AUGMENTIN) 875-125 MG tablet Take 1 tablet by mouth 2 (two) times daily. 20 tablet Tommie Sams, DO     PDMP not reviewed this encounter.   Tommie Sams, DO 11/21/19 2300

## 2019-11-23 LAB — AEROBIC CULTURE W GRAM STAIN (SUPERFICIAL SPECIMEN)

## 2020-11-09 ENCOUNTER — Emergency Department (HOSPITAL_COMMUNITY)
Admission: EM | Admit: 2020-11-09 | Discharge: 2020-11-09 | Disposition: A | Discharge status: Home / Self Care | Payer: Medicaid Other | Attending: Emergency Medicine | Admitting: Emergency Medicine

## 2020-11-09 ENCOUNTER — Emergency Department (HOSPITAL_COMMUNITY): Payer: Medicaid Other

## 2020-11-09 ENCOUNTER — Encounter (HOSPITAL_COMMUNITY): Payer: Self-pay | Admitting: Emergency Medicine

## 2020-11-09 ENCOUNTER — Other Ambulatory Visit: Payer: Self-pay

## 2020-11-09 DIAGNOSIS — R0602 Shortness of breath: Secondary | ICD-10-CM | POA: Diagnosis present

## 2020-11-09 DIAGNOSIS — T7840XA Allergy, unspecified, initial encounter: Secondary | ICD-10-CM | POA: Diagnosis not present

## 2020-11-09 DIAGNOSIS — E119 Type 2 diabetes mellitus without complications: Secondary | ICD-10-CM | POA: Diagnosis not present

## 2020-11-09 HISTORY — DX: Type 2 diabetes mellitus without complications: E11.9

## 2020-11-09 LAB — CBC WITH DIFFERENTIAL/PLATELET
Abs Immature Granulocytes: 0.02 10*3/uL (ref 0.00–0.07)
Basophils Absolute: 0 10*3/uL (ref 0.0–0.1)
Basophils Relative: 0 %
Eosinophils Absolute: 0.2 10*3/uL (ref 0.0–0.5)
Eosinophils Relative: 2 %
HCT: 53.7 % — ABNORMAL HIGH (ref 39.0–52.0)
Hemoglobin: 17.1 g/dL — ABNORMAL HIGH (ref 13.0–17.0)
Immature Granulocytes: 0 %
Lymphocytes Relative: 47 %
Lymphs Abs: 4.9 10*3/uL — ABNORMAL HIGH (ref 0.7–4.0)
MCH: 27 pg (ref 26.0–34.0)
MCHC: 31.8 g/dL (ref 30.0–36.0)
MCV: 84.8 fL (ref 80.0–100.0)
Monocytes Absolute: 0.9 10*3/uL (ref 0.1–1.0)
Monocytes Relative: 9 %
Neutro Abs: 4.4 10*3/uL (ref 1.7–7.7)
Neutrophils Relative %: 42 %
Platelets: 186 10*3/uL (ref 150–400)
RBC: 6.33 MIL/uL — ABNORMAL HIGH (ref 4.22–5.81)
RDW: 15.1 % (ref 11.5–15.5)
WBC: 10.4 10*3/uL (ref 4.0–10.5)
nRBC: 0 % (ref 0.0–0.2)

## 2020-11-09 LAB — BASIC METABOLIC PANEL
Anion gap: 13 (ref 5–15)
BUN: 11 mg/dL (ref 6–20)
CO2: 25 mmol/L (ref 22–32)
Calcium: 9.3 mg/dL (ref 8.9–10.3)
Chloride: 100 mmol/L (ref 98–111)
Creatinine, Ser: 0.95 mg/dL (ref 0.61–1.24)
GFR, Estimated: >60 mL/min (ref >60)
Glucose, Bld: 131 mg/dL — ABNORMAL HIGH (ref 70–99)
Potassium: 3.5 mmol/L (ref 3.5–5.1)
Sodium: 138 mmol/L (ref 135–145)

## 2020-11-09 MED ORDER — PREDNISONE 10 MG PO TABS: 40.0000 mg | ORAL_TABLET | Freq: Every day | ORAL | 0 refills | Status: DC

## 2020-11-09 MED ORDER — METHYLPREDNISOLONE SODIUM SUCC 125 MG IJ SOLR
125.0000 mg | Freq: Once | INTRAMUSCULAR | Status: AC
  Administered 2020-11-09: 125 mg via INTRAVENOUS
  Filled 2020-11-09: qty 2

## 2020-11-09 MED ORDER — RACEPINEPHRINE HCL 2.25 % IN NEBU
0.5000 mL | INHALATION_SOLUTION | Freq: Once | RESPIRATORY_TRACT | Status: AC
  Administered 2020-11-09: 0.5 mL via RESPIRATORY_TRACT
  Filled 2020-11-09: qty 0.5

## 2020-11-09 MED ORDER — EPINEPHRINE 0.3 MG/0.3ML IJ SOAJ: 0.3000 mg | INTRAMUSCULAR | 0 refills | Status: AC | PRN

## 2020-11-09 NOTE — ED Provider Notes (Signed)
[facility] EMERGENCY DEPARTMENT Provider Note   CSN: [id number] Arrival date & time: 11/09/20  1946     History Chief Complaint  Patient presents with   Allergic Reaction    Tony West is a 40 y.o. male.  40 year old male with prior medical history as detailed below presents for evaluation.  Patient with documented history of shellfish allergy.  Patient reports that he was at a [employer] when he possibly smelled some shrimp.  He developed shortness of breath and wheezing.  EMS transported him to the ED.  He is received 2 doses of IM epinephrine.  He is received 50 mg of p.o. Benadryl and 2 nebulizer treatments in route.  He does feel improved.  He denies tongue or oral swelling.  He denies prior history of intubation.  He denies prior hospitalization for allergic reaction.  The history is provided by the patient.  Allergic Reaction Presenting symptoms: difficulty breathing   Severity:  Mild Duration:  30 minutes Prior allergic episodes:  Food/nut allergies Relieved by:  Nothing Worsened by:  Nothing     Past Medical History:  Diagnosis Date   Diabetes mellitus without complication (HCC)     There are no problems to display for this patient.   History reviewed. No pertinent surgical history.     No family history on file.  Social History   Tobacco Use   Smoking status: Never   Smokeless tobacco: Never  Substance Use Topics   Alcohol use: Never   Drug use: Never    Home Medications Prior to Admission medications   Medication Sig Start Date End Date Taking? Authorizing Provider  EPINEPHrine 0.3 mg/0.3 mL IJ SOAJ injection Inject 0.3 mg into the muscle as needed for anaphylaxis. 11/09/20  Yes Wynetta Fines, MD  predniSONE (DELTASONE) 10 MG tablet Take 4 tablets (40 mg total) by mouth daily. 11/09/20  Yes Wynetta Fines, MD    Allergies    Shellfish allergy  Review of Systems   Review of Systems  All other systems  reviewed and are negative.  Physical Exam Updated Vital Signs BP (!) 141/80   Pulse (!) 104   Temp 97.7 F (36.5 C) (Temporal)   Resp 17   Ht 6\' 3"  (1.905 m)   Wt (!) 175 kg   SpO2 97%   BMI 48.22 kg/m   Physical Exam Vitals and nursing note reviewed.  Constitutional:      General: He is not in acute distress.    Appearance: Normal appearance. He is well-developed.  HENT:     Head: Normocephalic and atraumatic.     Mouth/Throat:     Comments: No oral or lingual edema Eyes:     Conjunctiva/sclera: Conjunctivae normal.     Pupils: Pupils are equal, round, and reactive to light.  Cardiovascular:     Rate and Rhythm: Normal rate and regular rhythm.     Heart sounds: Normal heart sounds.  Pulmonary:     Effort: Pulmonary effort is normal. No respiratory distress.     Breath sounds: Normal breath sounds.  Abdominal:     General: There is no distension.     Palpations: Abdomen is soft.     Tenderness: There is no abdominal tenderness.  Musculoskeletal:        General: No deformity. Normal range of motion.     Cervical back: Normal range of motion and neck supple.  Skin:    General: Skin is warm and dry.  Neurological:  General: No focal deficit present.     Mental Status: He is alert and oriented to person, place, and time.    ED Results / Procedures / Treatments   Labs (all labs ordered are listed, but only abnormal results are displayed) Labs Reviewed  BASIC METABOLIC PANEL - Abnormal; Notable for the following components:      Result Value   Glucose, Bld 131 (*)    All other components within normal limits  CBC WITH DIFFERENTIAL/PLATELET - Abnormal; Notable for the following components:   RBC 6.33 (*)    Hemoglobin 17.1 (*)    HCT 53.7 (*)    Lymphs Abs 4.9 (*)    All other components within normal limits    EKG None  Radiology DG Chest Port 1 View  Result Date: 11/09/2020 CLINICAL DATA:  Shortness of breath EXAM: PORTABLE CHEST 1 VIEW COMPARISON:   Portable exam 1956 hours compared to 05/30/2016 FINDINGS: Normal heart size and pulmonary vascularity. Prominent superior mediastinum, uncertain as to contribution from AP technique. Lungs clear. No pulmonary infiltrate, pleural effusion or pneumothorax. Osseous structures unremarkable. IMPRESSION: No acute infiltrates. Question prominent superior mediastinum versus projectional artifact related to AP technique; follow-up upright PA and lateral chest radiographs recommended for further assessment. Electronically Signed   By: Ulyses Southward M.D.   On: 11/09/2020 20:02    Procedures Procedures   Medications Ordered in ED Medications  methylPREDNISolone sodium succinate (SOLU-MEDROL) 125 mg/2 mL injection 125 mg (125 mg Intravenous Given 11/09/20 2003)  Racepinephrine HCl 2.25 % nebulizer solution 0.5 mL (0.5 mLs Nebulization Given 11/09/20 2003)    ED Course  I have reviewed the triage vital signs and the nursing notes.  Pertinent labs & imaging results that were available during my care of the patient were reviewed by me and considered in my medical decision making (see chart for details).    MDM Rules/Calculators/A&P                           MDM  MSE complete  Tony West was evaluated in Emergency Department on 11/09/2020 for the symptoms described in the history of present illness. He was evaluated in the context of the global COVID-19 pandemic, which necessitated consideration that the patient might be at risk for infection with the SARS-CoV-2 virus that causes COVID-19. Institutional protocols and algorithms that pertain to the evaluation of patients at risk for COVID-19 are in a state of rapid change based on information released by regulatory bodies including the CDC and federal and state organizations. These policies and algorithms were followed during the patient's care in the ED.  Patient is presenting with likely allergic reaction to seafood.  Patient was treated aggressively by  EMS.  Upon arrival to the ED the patient is improved.  Patient was observed for several hours.  At time of discharge she is comfortable.  He desires discharge.  He does understand need for close follow-up.  Strict return precautions given and understood.  Of note, patient will be on a prednisone burst for the next several days.  He understands how and when to check his sugars given his concurrent history of diabetes. Final Clinical Impression(s) / ED Diagnoses Final diagnoses:  Allergic reaction, initial encounter    Rx / DC Orders ED Discharge Orders          Ordered    predniSONE (DELTASONE) 10 MG tablet  Daily        11/09/20 2242  EPINEPHrine 0.3 mg/0.3 mL IJ SOAJ injection  As needed        11/09/20 2242             Wynetta Fines, MD 11/09/20 2244

## 2020-11-09 NOTE — ED Triage Notes (Signed)
Patient arrived with EMS from a local restaurant , he was exposed to shrimps ( did not ingest) this evening with sudden onset SOB and wheezing , he received 2 doses of Epinephrine IM , Benadryl 50 mg po and 2 doses of Albuterol nebulizer treatment prior to arrival with relief . Airway intact /no oral swelling .

## 2020-11-09 NOTE — Discharge Instructions (Signed)
Return for any problem.  ?

## 2020-11-11 ENCOUNTER — Encounter: Payer: Self-pay | Admitting: Emergency Medicine

## 2020-12-10 ENCOUNTER — Encounter (HOSPITAL_COMMUNITY): Payer: Self-pay | Admitting: Radiology

## 2020-12-20 ENCOUNTER — Other Ambulatory Visit: Payer: Self-pay | Admitting: Physician Assistant

## 2020-12-20 DIAGNOSIS — R0602 Shortness of breath: Secondary | ICD-10-CM

## 2022-01-21 ENCOUNTER — Other Ambulatory Visit: Payer: Self-pay | Admitting: Family Medicine

## 2022-01-21 DIAGNOSIS — R0602 Shortness of breath: Secondary | ICD-10-CM

## 2023-04-19 ENCOUNTER — Emergency Department
Admission: EM | Admit: 2023-04-19 | Discharge: 2023-04-19 | Disposition: A | Discharge status: Home / Self Care | Attending: Emergency Medicine | Admitting: Emergency Medicine

## 2023-04-19 ENCOUNTER — Other Ambulatory Visit: Payer: Self-pay

## 2023-04-19 ENCOUNTER — Emergency Department

## 2023-04-19 ENCOUNTER — Encounter: Payer: Self-pay | Admitting: Emergency Medicine

## 2023-04-19 DIAGNOSIS — W268XXA Contact with other sharp object(s), not elsewhere classified, initial encounter: Secondary | ICD-10-CM | POA: Insufficient documentation

## 2023-04-19 DIAGNOSIS — T148XXA Other injury of unspecified body region, initial encounter: Secondary | ICD-10-CM

## 2023-04-19 DIAGNOSIS — M79671 Pain in right foot: Secondary | ICD-10-CM

## 2023-04-19 DIAGNOSIS — S91331A Puncture wound without foreign body, right foot, initial encounter: Secondary | ICD-10-CM | POA: Diagnosis not present

## 2023-04-19 DIAGNOSIS — Z23 Encounter for immunization: Secondary | ICD-10-CM | POA: Insufficient documentation

## 2023-04-19 DIAGNOSIS — I10 Essential (primary) hypertension: Secondary | ICD-10-CM | POA: Diagnosis not present

## 2023-04-19 DIAGNOSIS — E119 Type 2 diabetes mellitus without complications: Secondary | ICD-10-CM | POA: Insufficient documentation

## 2023-04-19 MED ORDER — KETOROLAC TROMETHAMINE 30 MG/ML IJ SOLN
30.0000 mg | Freq: Once | INTRAMUSCULAR | Status: AC
  Administered 2023-04-19: 30 mg via INTRAMUSCULAR
  Filled 2023-04-19: qty 1

## 2023-04-19 MED ORDER — TETANUS-DIPHTH-ACELL PERTUSSIS 5-2.5-18.5 LF-MCG/0.5 IM SUSY
0.5000 mL | PREFILLED_SYRINGE | Freq: Once | INTRAMUSCULAR | Status: AC
  Administered 2023-04-19: 0.5 mL via INTRAMUSCULAR
  Filled 2023-04-19: qty 0.5

## 2023-04-19 MED ORDER — CEPHALEXIN 500 MG PO CAPS: 500.0000 mg | ORAL_CAPSULE | Freq: Four times a day (QID) | ORAL | 0 refills | Status: AC

## 2023-04-19 NOTE — ED Notes (Signed)
 See triage note  States he stepped on at pencil last pm  It went thur his tennis shoe and insole

## 2023-04-19 NOTE — Discharge Instructions (Signed)
 You were evaluated in the ED for right foot injury.  Your x-ray and ultrasound does not reveal a foreign body left in the foot.  We have extensively cleansed the area and your tetanus was up-to-date today.  Have been placed on antibiotic for prevention of infection.  Please take as instructed and until dose is complete even if symptoms improve.  Closely monitor for signs of infection such as spreading of redness around the area, pus or discharge and development of a fever.  Apply ice over the area to help with swelling.  You can alternate this with a warm compress to induce healing.  Elevate the affected foot above heart level is much as possible.

## 2023-04-19 NOTE — ED Provider Notes (Signed)
 [facility] Emergency Department Provider Note     Event Date/Time   First MD Initiated Contact with Patient 04/19/23 1741     (approximate)   History   Foot Pain   HPI  Tony West is a 43 y.o. male with a past medical history of HTN and diabetes presents to the ED for evaluation of right foot pain and puncture injury after stepping on a pencil yesterday.  Patient reports he was able to remove the pencil from his foot.  Unknown tetanus status.      Physical Exam   Triage Vital Signs: ED Triage Vitals  Encounter Vitals Group     BP 04/19/23 1547 (!) 155/115     Systolic BP Percentile --      Diastolic BP Percentile --      Pulse Rate 04/19/23 1547 98     Resp 04/19/23 1547 (!) 21     Temp 04/19/23 1547 97.9 F (36.6 C)     Temp Source 04/19/23 1547 Oral     SpO2 04/19/23 1547 97 %     Weight 04/19/23 1741 (!) 385 lb 12.9 oz (175 kg)     Height 04/19/23 1741 6\' 3"  (1.905 m)     Head Circumference --      Peak Flow --      Pain Score 04/19/23 1547 9     Pain Loc --      Pain Education --      Exclude from Growth Chart --     Most recent vital signs: Vitals:   04/19/23 1547 04/19/23 2003  BP: (!) 155/115 (!) 169/96  Pulse: 98 91  Resp: (!) 21 20  Temp: 97.9 F (36.6 C)   SpO2: 97% 97%    General Awake, no distress.  HEENT NCAT.  CV:  Good peripheral perfusion.  RESP:  Normal effort.  ABD:  No distention.  Other:  Small puncture wound on right foot on plantar aspect.  Bleeding controlled.  Tender to palpation.  No foreign body palpated.  Neurovascular status intact all throughout.   ED Results / Procedures / Treatments   Labs (all labs ordered are listed, but only abnormal results are displayed) Labs Reviewed - No data to display  RADIOLOGY  I personally viewed and evaluated these images as part of my medical decision making, as well as reviewing the written report by the radiologist.  ED Provider Interpretation: No  acute bony abnormality.  DG Foot Complete Right Result Date: 04/19/2023 CLINICAL DATA:  Stepped on a pencil. EXAM: RIGHT FOOT COMPLETE - 3+ VIEW COMPARISON:  None Available. FINDINGS: There is no evidence of fracture or dislocation. Moderate severity degenerative changes are seen along the dorsal aspect of the proximal to mid right foot. A 4 mm round opacity is seen within the plantar soft tissues of the distal right foot. This is at the level of the midportion of the fourth right metatarsal. IMPRESSION: 1. 4 mm round opacity within the plantar soft tissues of the distal right foot, as described above. While this may represent a small area of soft tissue calcification, correlation physical examination is recommended. Electronically Signed   By: Aram Candela M.D.   On: 04/19/2023 20:18    PROCEDURES:  Critical Care performed: No  Procedures   MEDICATIONS ORDERED IN ED: Medications  Tdap (BOOSTRIX) injection 0.5 mL (0.5 mLs Intramuscular Given 04/19/23 1901)  ketorolac (TORADOL) 30 MG/ML injection 30 mg (30 mg Intramuscular Given 04/19/23 1901)  IMPRESSION / MDM / ASSESSMENT AND PLAN / ED COURSE  I reviewed the triage vital signs and the nursing notes.                                 43 y.o. male presents to the emergency department for evaluation and treatment of acute right foot injury. See HPI for further details.   Differential diagnosis includes, but is not limited to foreign body, neurovascular injury, strain, puncture wound  Patient's presentation is most consistent with acute complicated illness / injury requiring diagnostic workup.  Ultrasound performed by myself given x-ray results of possible foreign body.  No foreign body noted.  Puncture wound open and explored thoroughly.  Extensively cleaned with chlorhexidine and pressure saline wash.  Appropriate dressing applied with gauze and Coban wrap.  Patient is placed in postop shoe.  Patient declined use of crutches.   Tetanus is up-to-date.  Encouraged proper wound care and RICE education provided.  Will prescribe 10-day course of antibiotics given diabetes status.  Encouraged to follow-up with PCP for further management as needed.  ED return precautions discussed.  Patient is in stable condition for discharge home.   FINAL CLINICAL IMPRESSION(S) / ED DIAGNOSES   Final diagnoses:  Puncture wound  Foot pain, right     Rx / DC Orders   ED Discharge Orders          Ordered    cephALEXin (KEFLEX) 500 MG capsule  4 times daily        04/19/23 2053             Note:  This document was prepared using Dragon voice recognition software and may include unintentional dictation errors.    Romeo Apple, Ryoma Nofziger A, PA-C 04/19/23 2101    Sharman Cheek, MD 04/19/23 2222

## 2023-04-19 NOTE — ED Triage Notes (Addendum)
 Reports last night stepped on thick coloring pencil that went through sole of shoe and sock. C/o pain in right foot.   Pmhx diabetes.

## 2023-04-19 NOTE — ED Provider Triage Note (Signed)
 Emergency Medicine Provider Triage Evaluation Note  Tony West , a 43 y.o. male  was evaluated in triage.  Pt complains of right foot injury. Patient states he stepped on a coloring pencil last ngiht, it punctured through his shoe and sock, it got stuck in his foot and he pulled it out. States "blood poured out" but it is no longer bleeding. Patient reports he is also diabetic.  Review of Systems  Positive: Right foot pain Negative:   Physical Exam  There were no vitals taken for this visit. Gen:   Awake, no distress   Resp:  Normal effort  MSK:   Moves extremities without difficulty  Other:  Puncture wound to the bottom of right foot, no surrounding erythema or warmth  Medical Decision Making  Medically screening exam initiated at 3:45 PM.  Appropriate orders placed.  Tony West was informed that the remainder of the evaluation will be completed by another provider, this initial triage assessment does not replace that evaluation, and the importance of remaining in the ED until their evaluation is complete.     Cameron Ali, PA-C 04/19/23 1548

## 2023-09-22 ENCOUNTER — Ambulatory Visit: Admission: EM | Admit: 2023-09-22 | Discharge: 2023-09-22 | Disposition: A | Discharge status: Home / Self Care

## 2023-09-22 ENCOUNTER — Ambulatory Visit (INDEPENDENT_AMBULATORY_CARE_PROVIDER_SITE_OTHER)

## 2023-09-22 ENCOUNTER — Ambulatory Visit: Payer: Self-pay | Admitting: Physician Assistant

## 2023-09-22 DIAGNOSIS — I10 Essential (primary) hypertension: Secondary | ICD-10-CM

## 2023-09-22 DIAGNOSIS — Z8639 Personal history of other endocrine, nutritional and metabolic disease: Secondary | ICD-10-CM | POA: Diagnosis not present

## 2023-09-22 DIAGNOSIS — L02519 Cutaneous abscess of unspecified hand: Secondary | ICD-10-CM

## 2023-09-22 DIAGNOSIS — L02512 Cutaneous abscess of left hand: Secondary | ICD-10-CM | POA: Diagnosis not present

## 2023-09-22 MED ORDER — DOXYCYCLINE HYCLATE 100 MG PO CAPS: 100.0000 mg | ORAL_CAPSULE | Freq: Two times a day (BID) | ORAL | 0 refills | Status: AC

## 2023-09-22 NOTE — ED Provider Notes (Signed)
 [facility]    CSN: [id number] Arrival date & time: 09/22/23  1200      History   Chief Complaint Chief Complaint  Patient presents with   Hand Pain    HPI Tony West is a 43 y.o. male with history of T2DM presenting for 1 week history of swelling, redness and pustule of the left palm. He denies known injury or foreign body. He reports he is a [employer] and will get the occasional scratch and scrape. He had punctured the abscess and a small amount of pus drained out. Also taking Motrin. No history of similar conditions or MRSA.   HPI  Past Medical History:  Diagnosis Date   Diabetes mellitus without complication (HCC)    Hypertension    Sleep apnea    CPAP prescribed but no longer using    Patient Active Problem List   Diagnosis Date Noted   Sepsis due to cellulitis (HCC) 11/15/2019    Past Surgical History:  Procedure Laterality Date   HERNIA REPAIR     ventral with mesh   INSERTION OF MESH     VENTRAL HERNIA REPAIR N/A 05/06/2017   Procedure: HERNIA REPAIR VENTRAL ADULT;  Surgeon: Rodolph Romano, MD;  Location: [facility] ORS;  Service: General;  Laterality: N/A;       Home Medications    Prior to Admission medications   Medication Sig Start Date End Date Taking? Authorizing Provider  clobetasol (TEMOVATE) 0.05 % external solution Apply topically 2 (two) times daily 04/06/23 04/05/24 Yes [provider]  doxycycline  (VIBRAMYCIN ) 100 MG capsule Take 1 capsule (100 mg total) by mouth 2 (two) times daily for 7 days. 09/22/23 09/29/23 Yes Arvis Jolan KATHEE, PA-C  empagliflozin  (JARDIANCE ) 10 MG TABS tablet Take 10 mg by mouth daily.     [provider]  EPINEPHrine  0.3 mg/0.3 mL IJ SOAJ injection Inject 0.3 mLs (0.3 mg total) into the muscle as needed for anaphylaxis. 10/10/19   Brimage, Vondra, DO  EPINEPHrine  0.3 mg/0.3 mL IJ SOAJ injection Inject 0.3 mg into the muscle as needed for anaphylaxis. 11/09/20   Laurice Maude BROCKS, MD  Insulin   Glargine (LANTUS  SOLOSTAR) 100 UNIT/ML Solostar Pen Inject 50 Units into the skin daily.     [provider]  lisinopril  (PRINIVIL ,ZESTRIL ) 5 MG tablet Take 5 mg by mouth daily.    [provider]  metFORMIN  (GLUCOPHAGE ) 500 MG tablet Take 1 tablet (500 mg total) by mouth daily. 11/16/19 12/16/19  Willette Adriana LABOR, MD    Family History Family History  Problem Relation Age of Onset   Diabetes Mother    Diabetes Father     Social History Social History   Tobacco Use   Smoking status: Never   Smokeless tobacco: Never  Vaping Use   Vaping status: Some Days  Substance Use Topics   Alcohol use: Never   Drug use: Never     Allergies   Shellfish allergy and Shellfish allergy   Review of Systems Review of Systems  Constitutional:  Negative for fever.  Musculoskeletal:  Positive for arthralgias and joint swelling.  Skin:  Positive for color change. Negative for rash and wound.  Neurological:  Negative for weakness and numbness.     Physical Exam Triage Vital Signs ED Triage Vitals  Encounter Vitals Group     BP      Girls Systolic BP Percentile      Girls Diastolic BP Percentile      Boys Systolic BP Percentile  Boys Diastolic BP Percentile      Pulse      Resp      Temp      Temp src      SpO2      Weight      Height      Head Circumference      Peak Flow      Pain Score      Pain Loc      Pain Education      Exclude from Growth Chart    No data found.  Updated Vital Signs BP (!) 165/101 (BP Location: Right Arm)   Pulse 85   Temp 98.5 F (36.9 C) (Oral)   Resp 16   Ht 6' 3 (1.905 m)   Wt (!) 367 lb 3.2 oz (166.6 kg)   SpO2 94%   BMI 45.90 kg/m     Physical Exam Vitals and nursing note reviewed.  Constitutional:      General: He is not in acute distress.    Appearance: Normal appearance. He is well-developed. He is not ill-appearing.  HENT:     Head: Normocephalic and atraumatic.  Eyes:     General: No scleral  icterus.    Conjunctiva/sclera: Conjunctivae normal.  Cardiovascular:     Rate and Rhythm: Normal rate.     Pulses: Normal pulses.  Pulmonary:     Effort: Pulmonary effort is normal. No respiratory distress.  Musculoskeletal:     Cervical back: Neck supple.  Skin:    General: Skin is warm and dry.     Capillary Refill: Capillary refill takes less than 2 seconds.     Findings: Erythema present.     Comments: LEFT HAND: Thre is an abscess ~2 cm x ~2 cm with surrounding erythema and tenderness. Mild fluctuance. Good pulses. Able to make a fist.   Neurological:     General: No focal deficit present.     Mental Status: He is alert. Mental status is at baseline.     Motor: No weakness.  Psychiatric:        Mood and Affect: Mood normal.      UC Treatments / Results  Labs (all labs ordered are listed, but only abnormal results are displayed) Labs Reviewed - No data to display  EKG   Radiology DG Hand Complete Left Result Date: 09/22/2023 CLINICAL DATA:  Left hand abscess for 1 week. EXAM: LEFT HAND - COMPLETE 3+ VIEW COMPARISON:  None Available. FINDINGS: There is no evidence of fracture or dislocation. There is no evidence of arthropathy or other focal bone abnormality. Soft tissues are unremarkable. No radiopaque foreign body. IMPRESSION: Negative. Electronically Signed   By: Lynwood Landy Raddle M.D.   On: 09/22/2023 13:11    Procedures Procedures (including critical care time)  Medications Ordered in UC Medications - No data to display  Initial Impression / Assessment and Plan / UC Course  I have reviewed the triage vital signs and the nursing notes.  Pertinent labs & imaging results that were available during my care of the patient were reviewed by me and considered in my medical decision making (see chart for details).   43 y/o male with history of T2DM presents for abscess of left palm x 1 week. No known injury or foreign body. See image included in chart.   Will obtain  xray of hand to assess for possible underlying FB. X-ray negative.   Abscess of palm. Sent doxycycline  to pharmacy. Advised warm compresses/soaks.  Referral placed to hand specialist. Reviewed ED precautions.   BP 169/105. Repeat 165/101.  Advised to continue lisinopril  and keep BP log. If consistently >140/90 to follow up with PCP for medication changes.    Final Clinical Impressions(s) / UC Diagnoses   Final diagnoses:  Abscess of palm  Essential hypertension  History of uncontrolled diabetes     Discharge Instructions      -Xray does not show foreign body -You have an abscess/infection of hand -Begin antibiotics and continue using warm compresses, soaking hand in warm water -Follow up with hand surgery. They may need to drain this.  -Go to ER if fever or worsening of symptoms   -Continue BP meds. If consistently >140/90 please follow up with PCP.     ED Prescriptions     Medication Sig Dispense Auth. Provider   doxycycline  (VIBRAMYCIN ) 100 MG capsule Take 1 capsule (100 mg total) by mouth 2 (two) times daily for 7 days. 14 capsule Arvis Jolan NOVAK, PA-C      PDMP not reviewed this encounter.   Arvis Jolan NOVAK, PA-C 09/22/23 [phone]

## 2023-09-22 NOTE — Discharge Instructions (Addendum)
-  Xray does not show foreign body -You have an abscess/infection of hand -Begin antibiotics and continue using warm compresses, soaking hand in warm water -Follow up with hand surgery. They may need to drain this.  -Go to ER if fever or worsening of symptoms   -Continue BP meds. If consistently >140/90 please follow up with PCP.

## 2023-09-22 NOTE — ED Triage Notes (Signed)
 Pt c/o abscess on L palm x1 wk. Area red,swollen & tender. Has tried draining it w/o relief. Has tried OTC meds w/o relief.

## 2023-09-24 ENCOUNTER — Telehealth: Payer: Self-pay | Admitting: Emergency Medicine

## 2023-09-24 NOTE — Telephone Encounter (Signed)
 Orthopedic referral has been placed for Ortho care.  The doctor is out of the office and they requested the patient be referred to [facility], specifically Dr. Shari.  Referral placed.

## 2023-10-27 ENCOUNTER — Ambulatory Visit
Admission: EM | Admit: 2023-10-27 | Discharge: 2023-10-27 | Disposition: A | Discharge status: Home / Self Care | Attending: Family Medicine | Admitting: Family Medicine

## 2023-10-27 DIAGNOSIS — F17210 Nicotine dependence, cigarettes, uncomplicated: Secondary | ICD-10-CM

## 2023-10-27 DIAGNOSIS — J40 Bronchitis, not specified as acute or chronic: Secondary | ICD-10-CM

## 2023-10-27 MED ORDER — PROMETHAZINE-DM 6.25-15 MG/5ML PO SYRP: 5.0000 mL | ORAL_SOLUTION | Freq: Four times a day (QID) | ORAL | 0 refills | Status: AC | PRN

## 2023-10-27 MED ORDER — AZITHROMYCIN 250 MG PO TABS: ORAL_TABLET | ORAL | 0 refills | Status: AC

## 2023-10-27 MED ORDER — ALBUTEROL SULFATE HFA 108 (90 BASE) MCG/ACT IN AERS: 2.0000 | INHALATION_SPRAY | RESPIRATORY_TRACT | 0 refills | Status: AC | PRN

## 2023-10-27 MED ORDER — PREDNISONE 10 MG (21) PO TBPK: ORAL_TABLET | Freq: Every day | ORAL | 0 refills | Status: AC

## 2023-10-27 NOTE — ED Provider Notes (Signed)
 [facility]    CSN: [id number] Arrival date & time: 10/27/23  1750      History   Chief Complaint Chief Complaint  Patient presents with   Cough   Wheezing    HPI ARMISTEAD SULT is a 43 y.o. male.   HPI  History obtained from the patient. Garion presents for cough, wheezing and rib pain due to coughing for the past 2 weeks.  Started out as a respiratory infection.  He is not able to lay down due to his stretching rib pain.  Has been sweating more than he usually does.  No known fever, vomiting or diarrhea.  Endorses nasal congestion but this is getting better.  Has been using an expired inhaler.  Smokes cigarettes.  Denies COPD and asthma.        Past Medical History:  Diagnosis Date   Diabetes mellitus without complication (HCC)    Hypertension    Sleep apnea    CPAP prescribed but no longer using    Patient Active Problem List   Diagnosis Date Noted   Sepsis due to cellulitis (HCC) 11/15/2019    Past Surgical History:  Procedure Laterality Date   HERNIA REPAIR     ventral with mesh   INSERTION OF MESH     VENTRAL HERNIA REPAIR N/A 05/06/2017   Procedure: HERNIA REPAIR VENTRAL ADULT;  Surgeon: Rodolph Romano, MD;  Location: [facility] ORS;  Service: General;  Laterality: N/A;       Home Medications    Prior to Admission medications   Medication Sig Start Date End Date Taking? Authorizing Provider  albuterol  (VENTOLIN  HFA) 108 (90 Base) MCG/ACT inhaler Inhale 2 puffs into the lungs every 4 (four) hours as needed. 10/27/23  Yes Adewale Pucillo, DO  atorvastatin (LIPITOR) 20 MG tablet Take 20 mg by mouth. 04/21/19  Yes [provider]  azithromycin  (ZITHROMAX  Z-PAK) 250 MG tablet Take 2 tablets on day 1 then 1 tablet daily 10/27/23  Yes Indra Wolters, DO  empagliflozin  (JARDIANCE ) 10 MG TABS tablet Take 10 mg by mouth daily.    Yes [provider]  Insulin  Glargine (LANTUS  SOLOSTAR) 100 UNIT/ML Solostar Pen Inject 50 Units  into the skin daily.    Yes [provider]  insulin  lispro (HUMALOG) 100 UNIT/ML KwikPen Inject 16 Units into the skin. 03/04/22  Yes [provider]  lisinopril  (PRINIVIL ,ZESTRIL ) 5 MG tablet Take 5 mg by mouth daily.   Yes [provider]  predniSONE  (STERAPRED UNI-PAK 21 TAB) 10 MG (21) TBPK tablet Take by mouth daily. Take 6 tabs by mouth daily for 1, then 5 tabs for 1 day, then 4 tabs for 1 day, then 3 tabs for 1 day, then 2 tabs for 1 day, then 1 tab for 1 day. 10/27/23  Yes Kenlei Safi, DO  promethazine -dextromethorphan (PROMETHAZINE -DM) 6.25-15 MG/5ML syrup Take 5 mLs by mouth 4 (four) times daily as needed. 10/27/23  Yes Hart Haas, DO  Semaglutide, 1 MG/DOSE, 4 MG/3ML SOPN Inject 1 mg into the skin. 10/25/23  Yes [provider]  clobetasol (TEMOVATE) 0.05 % external solution Apply topically 2 (two) times daily 04/06/23 04/05/24  [provider]  EPINEPHrine  0.3 mg/0.3 mL IJ SOAJ injection Inject 0.3 mLs (0.3 mg total) into the muscle as needed for anaphylaxis. 10/10/19   Torry Istre, DO  EPINEPHrine  0.3 mg/0.3 mL IJ SOAJ injection Inject 0.3 mg into the muscle as needed for anaphylaxis. 11/09/20   Laurice Maude BROCKS, MD  metFORMIN  (GLUCOPHAGE ) 500  MG tablet Take 1 tablet (500 mg total) by mouth daily. 11/16/19 12/16/19  Willette Adriana LABOR, MD    Family History Family History  Problem Relation Age of Onset   Diabetes Mother    Diabetes Father     Social History Social History   Tobacco Use   Smoking status: Never   Smokeless tobacco: Never  Vaping Use   Vaping status: Some Days  Substance Use Topics   Alcohol use: Never   Drug use: Never     Allergies   Shellfish allergy and Shellfish allergy   Review of Systems Review of Systems: negative unless otherwise stated in HPI.      Physical Exam Triage Vital Signs ED Triage Vitals  Encounter Vitals Group     BP 10/27/23 1802 (!) 173/94     Girls Systolic BP Percentile --       Girls Diastolic BP Percentile --      Boys Systolic BP Percentile --      Boys Diastolic BP Percentile --      Pulse Rate 10/27/23 1802 (!) 101     Resp 10/27/23 1802 19     Temp 10/27/23 1802 98.8 F (37.1 C)     Temp Source 10/27/23 1802 Oral     SpO2 10/27/23 1802 94 %     Weight --      Height --      Head Circumference --      Peak Flow --      Pain Score 10/27/23 1800 8     Pain Loc --      Pain Education --      Exclude from Growth Chart --    No data found.  Updated Vital Signs BP (!) 158/90 (BP Location: Left Arm)   Pulse 94   Temp 98.8 F (37.1 C) (Oral)   Resp 18   SpO2 96%   Visual Acuity Right Eye Distance:   Left Eye Distance:   Bilateral Distance:    Right Eye Near:   Left Eye Near:    Bilateral Near:     Physical Exam GEN:     alert, non-toxic appearing male in no distress    HENT:  mucus membranes moist, no nasal discharge EYES:   no scleral injection or discharge NECK:  normal ROM, enlarge neck circumference   RESP:  no increased work of breathing, rhonchi bilaterally CVS:   regular rate and rhythm Skin:   warm and dry    UC Treatments / Results  Labs (all labs ordered are listed, but only abnormal results are displayed) Labs Reviewed - No data to display  EKG   Radiology No results found.   Procedures Procedures (including critical care time)  Medications Ordered in UC Medications - No data to display  Initial Impression / Assessment and Plan / UC Course  I have reviewed the triage vital signs and the nursing notes.  Pertinent labs & imaging results that were available during my care of the patient were reviewed by me and considered in my medical decision making (see chart for details).       Pt is a 43 y.o. male who presents for 2-3 weeks of cough that is not improving.  Benjamyn is  afebrile here without recent antipyretics. Satting adequately on room air. Overall pt is  non-toxic appearing, well hydrated, without  respiratory distress. Pulmonary exam is remarkable for rhonchi that clear with cough.  After shared decision making, we will not pursue  chest x-ray at this time. COVID  and influenza testing deferred due to length of symptoms.   Treat  acute smoking related bronchitis with steroids and antibiotics as below.  Albuterol  for bronchospasm. Promethazine  DM cough syrup given for cough and allow patient to rest.  Typical duration of symptoms discussed. Return and ED precautions given and patient voiced understanding.   Discussed MDM, treatment plan and plan for follow-up with patient who agrees with plan.      Final Clinical Impressions(s) / UC Diagnoses   Final diagnoses:  Bronchitis  Cigarette smoker     Discharge Instructions      Stop by the pharmacy to pick up your prescriptions.  Follow up with your primary care provider or return to the urgent care, if not improving.        ED Prescriptions     Medication Sig Dispense Auth. Provider   predniSONE  (STERAPRED UNI-PAK 21 TAB) 10 MG (21) TBPK tablet Take by mouth daily. Take 6 tabs by mouth daily for 1, then 5 tabs for 1 day, then 4 tabs for 1 day, then 3 tabs for 1 day, then 2 tabs for 1 day, then 1 tab for 1 day. 21 tablet Kelcy Laible, DO   azithromycin  (ZITHROMAX  Z-PAK) 250 MG tablet Take 2 tablets on day 1 then 1 tablet daily 6 tablet Emmilee Reamer, DO   promethazine -dextromethorphan (PROMETHAZINE -DM) 6.25-15 MG/5ML syrup Take 5 mLs by mouth 4 (four) times daily as needed. 118 mL Jacqulyne Gladue, DO   albuterol  (VENTOLIN  HFA) 108 (90 Base) MCG/ACT inhaler Inhale 2 puffs into the lungs every 4 (four) hours as needed. 6.7 g Reagan Klemz, DO      PDMP not reviewed this encounter.   Espn Zeman, DO 10/27/23 1904

## 2023-10-27 NOTE — Discharge Instructions (Signed)
 Stop by the pharmacy to pick up your prescriptions.  Follow up with your primary care provider or return to the urgent care, if not improving.

## 2023-10-27 NOTE — ED Triage Notes (Addendum)
 Sx x 2 weeks  Cough Wheezing Ribs hurt from coughing  Patient smokes
